# Patient Record
Sex: Male | Born: 1945 | Race: White | Hispanic: No | Marital: Married | Smoking: Never smoker
Health system: Southern US, Community
[De-identification: ages and names within clinical notes are randomized; demographics above are authoritative.]

## PROBLEM LIST (undated history)

## (undated) DIAGNOSIS — N529 Male erectile dysfunction, unspecified: Secondary | ICD-10-CM

## (undated) DIAGNOSIS — T7840XA Allergy, unspecified, initial encounter: Secondary | ICD-10-CM

## (undated) DIAGNOSIS — E785 Hyperlipidemia, unspecified: Secondary | ICD-10-CM

## (undated) DIAGNOSIS — I10 Essential (primary) hypertension: Secondary | ICD-10-CM

## (undated) DIAGNOSIS — Z8619 Personal history of other infectious and parasitic diseases: Secondary | ICD-10-CM

## (undated) HISTORY — PX: TONSILLECTOMY: SHX5217

## (undated) HISTORY — DX: Essential (primary) hypertension: I10

## (undated) HISTORY — DX: Personal history of other infectious and parasitic diseases: Z86.19

## (undated) HISTORY — DX: Allergy, unspecified, initial encounter: T78.40XA

## (undated) HISTORY — DX: Hyperlipidemia, unspecified: E78.5

## (undated) HISTORY — DX: Male erectile dysfunction, unspecified: N52.9

---

## 2008-02-25 ENCOUNTER — Encounter (INDEPENDENT_AMBULATORY_CARE_PROVIDER_SITE_OTHER): Payer: Self-pay | Admitting: *Deleted

## 2009-02-20 ENCOUNTER — Ambulatory Visit: Payer: Self-pay | Admitting: Family Medicine

## 2009-02-20 DIAGNOSIS — I10 Essential (primary) hypertension: Secondary | ICD-10-CM

## 2009-02-20 DIAGNOSIS — E785 Hyperlipidemia, unspecified: Secondary | ICD-10-CM

## 2009-02-20 DIAGNOSIS — N529 Male erectile dysfunction, unspecified: Secondary | ICD-10-CM | POA: Insufficient documentation

## 2009-02-20 HISTORY — DX: Hyperlipidemia, unspecified: E78.5

## 2009-02-20 HISTORY — DX: Essential (primary) hypertension: I10

## 2009-02-20 HISTORY — DX: Male erectile dysfunction, unspecified: N52.9

## 2009-02-22 LAB — CONVERTED CEMR LAB
ALT: 18 U/L
AST: 22 U/L
Albumin: 4.3 g/dL
Alkaline Phosphatase: 32 U/L — ABNORMAL LOW
BUN: 13 mg/dL
Bilirubin, Direct: 0 mg/dL
CO2: 32 meq/L
Calcium: 9.3 mg/dL
Chloride: 100 meq/L
Cholesterol: 179 mg/dL
Creatinine, Ser: 0.9 mg/dL
GFR calc non Af Amer: 90.47 mL/min
Glucose, Bld: 86 mg/dL
HDL: 55.2 mg/dL
LDL Cholesterol: 100 mg/dL — ABNORMAL HIGH
Potassium: 4.2 meq/L
Sodium: 137 meq/L
Total Bilirubin: 0.8 mg/dL
Total CHOL/HDL Ratio: 3
Total Protein: 6.3 g/dL
Triglycerides: 117 mg/dL
VLDL: 23.4 mg/dL

## 2009-12-25 ENCOUNTER — Ambulatory Visit: Payer: Self-pay | Admitting: Family Medicine

## 2009-12-25 LAB — CONVERTED CEMR LAB
HDL goal, serum: 40 mg/dL
LDL Goal: 130 mg/dL

## 2009-12-26 ENCOUNTER — Encounter (INDEPENDENT_AMBULATORY_CARE_PROVIDER_SITE_OTHER): Payer: Self-pay | Admitting: *Deleted

## 2009-12-26 LAB — CONVERTED CEMR LAB
ALT: 15 units/L (ref 0–53)
AST: 18 units/L (ref 0–37)
BUN: 12 mg/dL (ref 6–23)
Bilirubin, Direct: 0.1 mg/dL (ref 0.0–0.3)
Calcium: 9.7 mg/dL (ref 8.4–10.5)
Creatinine, Ser: 0.8 mg/dL (ref 0.4–1.5)
GFR calc non Af Amer: 109.66 mL/min (ref >60)
Glucose, Bld: 87 mg/dL (ref 70–99)
HDL: 61.7 mg/dL (ref >39.00)
Total Bilirubin: 0.7 mg/dL (ref 0.3–1.2)

## 2010-01-19 ENCOUNTER — Encounter (INDEPENDENT_AMBULATORY_CARE_PROVIDER_SITE_OTHER): Payer: Self-pay | Admitting: *Deleted

## 2010-01-22 ENCOUNTER — Ambulatory Visit: Payer: Self-pay | Admitting: Internal Medicine

## 2010-02-05 ENCOUNTER — Ambulatory Visit: Payer: Self-pay | Admitting: Internal Medicine

## 2010-02-05 LAB — HM COLONOSCOPY

## 2010-06-12 NOTE — Miscellaneous (Signed)
Summary: [facility] Previsit/prep  Clinical Lists Changes  Medications: Added new medication of MOVIPREP 100 GM  SOLR (PEG-KCL-NACL-NASULF-NA ASC-C) As per prep instructions. - Signed Rx of MOVIPREP 100 GM  SOLR (PEG-KCL-NACL-NASULF-NA ASC-C) As per prep instructions.;  #1 x 0;  Signed;  Entered by: Wyona Almas RN;  Authorized by: Hart Carwin MD;  Method used: [facility]  [address] [phone]*, [address], [place], [place]  [address], Ph: [phone] or [phone], Fax: [phone] Observations: Added new observation of NKA: T (01/22/2010 15:19)    Prescriptions: MOVIPREP 100 GM  SOLR (PEG-KCL-NACL-NASULF-NA ASC-C) As per prep instructions.  #1 x 0   Entered by:   Wyona Almas RN   Authorized by:   Hart Carwin MD   Signed by:   Wyona Almas RN on 01/22/2010   Method used:   [facility]  [address] [phone]* (retail)       [address]       [place], [place]  [address]       Ph: [phone] or [phone]       Fax: [phone]   RxID:   [id number]

## 2010-06-12 NOTE — Procedures (Signed)
Summary: Colonoscopy  Patient: [name] Note: All result statuses are Final unless otherwise noted.  Tests: (1) Colonoscopy (COL)   COL Colonoscopy           DONE     [facility]     [address]     [place], [place]  [address]           COLONOSCOPY PROCEDURE REPORT           PATIENT:  Paul Burton, Paul Burton  MR#:  [id number]     BIRTHDATE:  May 04, 1946, 64 yrs. old  GENDER:  male     ENDOSCOPIST:  Hedwig Morton. Juanda Chance, MD     REF. BY:  Evelena Peat, M.D.     PROCEDURE DATE:  02/05/2010     PROCEDURE:  Colonoscopy [address]     ASA CLASS:  Class I     INDICATIONS:  Elevated Risk Screening father with colon cancer     MEDICATIONS:   Versed 5 mg, Fentanyl 50 mcg           DESCRIPTION OF PROCEDURE:   After the risks benefits and     alternatives of the procedure were thoroughly explained, informed     consent was obtained.  Digital rectal exam was performed and     revealed no rectal masses.   The LB [id number] endoscope     was introduced through the anus and advanced to the cecum, which     was identified by both the appendix and ileocecal valve, without     limitations.  The quality of the prep was excellent, using     MoviPrep.  The instrument was then slowly withdrawn as the colon     was fully examined.     <<PROCEDUREIMAGES>>           FINDINGS:  Mild diverticulosis was found in the sigmoid colon (see     image1).  This was otherwise a normal examination of the colon     (see image2, image3, image4, and image5).   Retroflexed views in     the rectum revealed no abnormalities.    The scope was then     withdrawn from the patient and the procedure completed.           COMPLICATIONS:  None     ENDOSCOPIC IMPRESSION:     1) Mild diverticulosis in the sigmoid colon     2) Otherwise normal examination     RECOMMENDATIONS:     1) high fiber diet     REPEAT EXAM:  In 10 year(s) for.           ______________________________     Hedwig Morton. Juanda Chance, MD        CC:           n.     eSIGNED:   Hedwig Morton. [name] at 02/05/2010 01:54 PM           [name], [name], [phone]  Note: An exclamation mark (!) indicates a result that was not dispersed into the flowsheet. Document Creation Date: 02/05/2010 1:55 PM _______________________________________________________________________  (1) Order result status: Final Collection or observation date-time: 02/05/2010 13:49 Requested date-time:  Receipt date-time:  Reported date-time:  Referring Physician:   Ordering Physician: Lina Sar [phone]) Specimen Source:  Source: Launa Grill Order Number: [id number] Lab site:   Appended Document: Colonoscopy    Clinical Lists Changes  Observations: Added new observation of COLONNXTDUE: 01/2020 (02/05/2010 16:03)

## 2010-06-12 NOTE — Letter (Signed)
Summary: Pre Visit Letter Revised  [facility]  [address] [place], [place] [address]   Phone: [phone]  Fax: [phone]    12/26/2009 MRN: [id number]  [facility] [address] [place], [place]  [address]              Procedure Date:  02/05/2010    Welcome to the Gastroenterology Division at [facility].    You are scheduled to see a nurse for your pre-procedure visit on 01/22/2010 at 3:30PM on the 3rd floor at [facility], [address]  We ask that you try to arrive at our office 15 minutes prior to your appointment time to allow for check-in.  Please take a minute to review the attached form.  If you answer "Yes" to one or more of the questions on the first page, we ask that you call the person listed at your earliest opportunity.  If you answer "No" to all of the questions, please complete the rest of the form and bring it to your appointment.    Your nurse visit will consist of discussing your medical and surgical history, your immediate family medical history, and your medications.    If you are unable to list all of your medications on the form, please bring the medication bottles to your appointment and we will list them.  We will need to be aware of both prescribed and over the counter drugs.  We will need to know exact dosage information as well.    Please be prepared to read and sign documents such as consent forms, a financial agreement, and acknowledgement forms.  If necessary, and with your consent, a friend or relative is welcome to sit-in on the nurse visit with you.  Please bring your insurance card so that we may make a copy of it.  If your insurance requires a referral to see a specialist, please bring your referral form from your primary care physician.  No co-pay is required for this nurse visit.     If you cannot keep your appointment, please call [phone] to cancel or reschedule prior to your appointment date.  This  allows [place] the opportunity to schedule an appointment for another patient in need of care.   Thank you for choosing [facility] for your medical needs.  We appreciate the opportunity to care for you.  Please visit [place] at our website  to learn more about our practice.                     Sincerely,  The Gastroenterology Division

## 2010-06-12 NOTE — Assessment & Plan Note (Signed)
Summary: refill meds//ccm   Vital Signs:  Patient profile:   65 year old male Weight:      155 pounds Temp:     97.7 degrees F BP sitting:   126 / 70  History of Present Illness: Patient here for medical followup. Has lost almost 20 pounds since last visit due to his efforts. He has scaled back snacking and increased exercise. Feels very good overall.  Hypertension treated with Ramiri. Probable white coat syndrome. Blood pressure is well controlled by home readings.  Hyperlipidemia treated with Crestor. Doing well no side effects. Compliant with therapy.  Positive family history colon cancer in his father. Patient never had previous screening. He agrees at this point for referral.  Hypertension History:      He denies headache, chest pain, palpitations, dyspnea with exertion, orthopnea, PND, peripheral edema, visual symptoms, neurologic problems, syncope, and side effects from treatment.        Positive major cardiovascular risk factors include male age 39 years old or older, hyperlipidemia, and hypertension.  Negative major cardiovascular risk factors include no history of diabetes, negative family history for ischemic heart disease, and non-tobacco-user status.        Further assessment for target organ damage reveals no history of ASHD, stroke/TIA, or peripheral vascular disease.    Lipid Management History:      Positive NCEP/ATP III risk factors include male age 59 years old or older and hypertension.  Negative NCEP/ATP III risk factors include non-diabetic, no family history for ischemic heart disease, non-tobacco-user status, no ASHD (atherosclerotic heart disease), no prior stroke/TIA, no peripheral vascular disease, and no history of aortic aneurysm.      Allergies: No Known Drug Allergies  Past History:  Past Medical History: Last updated: 02/20/2009 Chicken pox Hay fever, allergies Hyperlipidemia Hypertension  Past Surgical History: Last updated:  02/20/2009 Tonsillectomy  1950  Family History: Last updated: 02/20/2009 Family History of Alcoholism/Addiction Family History of Colon CA  Family History Hypertension Family history Heart disease  Social History: Last updated: 02/20/2009 Occupation:  Interior and spatial designer of [employer] Married Never Smoked Alcohol use-yes  Risk Factors: Smoking Status: never (02/20/2009) PMH-FH-SH reviewed for relevance  Review of Systems      See HPI  Physical Exam  General:  Well-developed,well-nourished,in no acute distress; alert,appropriate and cooperative throughout examination Mouth:  Oral mucosa and oropharynx without lesions or exudates.  Teeth in good repair. Neck:  No deformities, masses, or tenderness noted. Lungs:  Normal respiratory effort, chest expands symmetrically. Lungs are clear to auscultation, no crackles or wheezes. Heart:  Normal rate and regular rhythm. S1 and S2 normal without gallop, murmur, click, rub or other extra sounds. Extremities:  No clubbing, cyanosis, edema, or deformity noted with normal full range of motion of all joints.   Cervical Nodes:  No lymphadenopathy noted Psych:  normally interactive, good eye contact, not anxious appearing, and not depressed appearing.     Impression & Recommendations:  Problem # 1:  HYPERLIPIDEMIA (ICD-272.4)  His updated medication list for this problem includes:    Crestor 5 Mg Tabs (Rosuvastatin calcium) ..... Once daily  Orders: TLB-Lipid Panel (80061-LIPID) TLB-Hepatic/Liver Function Pnl (80076-HEPATIC) Venipuncture ([address]) Specimen Handling ([address])  Problem # 2:  HYPERTENSION (ICD-401.9)  His updated medication list for this problem includes:    Ramipril 5 Mg Caps (Ramipril) ..... Once daily  Orders: TLB-BMP (Basic Metabolic Panel-BMET) (80048-METABOL) Venipuncture (09811) Specimen Handling ([address])  Problem # 3:  FAMILY HISTORY OF COLON CA 1ST DEGREE RELATIVE <60 (  ICD-V16.0) schedule screening  colonoscopy. Orders: Gastroenterology Referral (GI)  Complete Medication List: 1)  Crestor 5 Mg Tabs (Rosuvastatin calcium) .... Once daily 2)  Ramipril 5 Mg Caps (Ramipril) .... Once daily 3)  Aspirin 325 Mg Tabs (Aspirin) .... Once daily 4)  Tinactin Jock Itch 1 % Crea (Tolnaftate) .... Once daily 5)  Mens 50+ Multi Vitamin/min Tabs (Multiple vitamins-minerals) .... Once daily 6)  Viagra 100 Mg Tabs (Sildenafil citrate) .... One tablet by mouth once daily as directed.  Hypertension Assessment/Plan:      The patient's hypertensive risk group is category B: At least one risk factor (excluding diabetes) with no target organ damage.  His calculated 10 year risk of coronary heart disease is 11 %.  Today's blood pressure is 126/70.    Lipid Assessment/Plan:      Based on NCEP/ATP III, the patient's risk factor category is "2 or more risk factors and a calculated 10 year CAD risk of < 20%".  The patient's lipid goals are as follows: Total cholesterol goal is 200; LDL cholesterol goal is 130; HDL cholesterol goal is 40; Triglyceride goal is 150.    Patient Instructions: 1)  Please schedule a follow-up appointment in 1 year.  2)  It is important that you exercise reguarly at least 20 minutes 5 times a week. If you develop chest pain, have severe difficulty breathing, or feel very tired, stop exercising immediately and seek medical attention.  3)  Check your  Blood Pressure regularly . If it is above:140/90   you should make an appointment. Prescriptions: RAMIPRIL 5 MG CAPS (RAMIPRIL) once daily  #90 x 3   Entered and Authorized by:   Evelena Peat MD   Signed by:   Evelena Peat MD on 12/25/2009   Method used:   Faxed to ...       [facility]       [address] [place], [place]  [address]       Ph: [phone]       Fax: [phone]   RxID:   [id number] CRESTOR 5 MG TABS (ROSUVASTATIN CALCIUM) once daily  #90 x 3   Entered and Authorized by:   Evelena Peat MD    Signed by:   Evelena Peat MD on 12/25/2009   Method used:   Faxed to ...       [facility]       [address] [place], [place]  [address]       Ph: [phone]       Fax: [phone]   RxID:   [id number] RAMIPRIL 5 MG CAPS (RAMIPRIL) once daily  #90 x 3   Entered and Authorized by:   Evelena Peat MD   Signed by:   Evelena Peat MD on 12/25/2009   Method used:   Print then Give to Patient   RxID:   [id number] CRESTOR 5 MG TABS (ROSUVASTATIN CALCIUM) once daily  #90 x 3   Entered and Authorized by:   Evelena Peat MD   Signed by:   Evelena Peat MD on 12/25/2009   Method used:   Print then Give to Patient   RxID:   [id number]

## 2010-06-12 NOTE — Letter (Signed)
Summary: [facility] Instructions  [facility]  [address] [place], [place] [address]   Phone: [phone]  Fax: [phone]       Paul Burton    10-09-1945    MRN: [id number]        Procedure Day Dorna Bloom: [facility] 11/05/09     Arrival Time: 12:30PM     Procedure Time: 1:30PM     Location of Procedure:                    _X_  [facility] (4th Floor)                        PREPARATION FOR COLONOSCOPY WITH MOVIPREP   Starting 5 days prior to your procedure 01/31/2010 do not eat nuts, seeds, popcorn, corn, beans, peas,  salads, or any raw vegetables.  Do not take any fiber supplements (e.g. Metamucil, Citrucel, and Benefiber).  THE DAY BEFORE YOUR PROCEDURE         DATE: 02/04/10    DAY: SUNDAY  1.  Drink clear liquids the entire day-NO SOLID FOOD  2.  Do not drink anything colored red or purple.  Avoid juices with pulp.  No orange juice.  3.  Drink at least 64 oz. (8 glasses) of fluid/clear liquids during the day to prevent dehydration and help the prep work efficiently.  CLEAR LIQUIDS INCLUDE: Water Jello Ice Popsicles Tea (sugar ok, no milk/cream) Powdered fruit flavored drinks Coffee (sugar ok, no milk/cream) Gatorade Juice: apple, white grape, white cranberry  Lemonade Clear bullion, consomm, broth Carbonated beverages (any kind) Strained chicken noodle soup Hard Candy                             4.  In the morning, mix first dose of MoviPrep solution:    Empty 1 Pouch A and 1 Pouch B into the disposable container    Add lukewarm drinking water to the top line of the container. Mix to dissolve    Refrigerate (mixed solution should be used within 24 hrs)  5.  Begin drinking the prep at 5:00 p.m. The MoviPrep container is divided by 4 marks.   Every 15 minutes drink the solution down to the next mark (approximately 8 oz) until the full liter is complete.   6.  Follow completed prep with 16 oz of clear liquid of your choice  (Nothing red or purple).  Continue to drink clear liquids until bedtime.  7.  Before going to bed, mix second dose of MoviPrep solution:    Empty 1 Pouch A and 1 Pouch B into the disposable container    Add lukewarm drinking water to the top line of the container. Mix to dissolve    Refrigerate  THE DAY OF YOUR PROCEDURE      DATE: 02/05/10    DAY: MONDAY  Beginning at 8:30AM (5 hours before procedure):         1. Every 15 minutes, drink the solution down to the next mark (approx 8 oz) until the full liter is complete.  2. Follow completed prep with 16 oz. of clear liquid of your choice.    3. You may drink clear liquids until 11:30AM (2 HOURS BEFORE PROCEDURE).   MEDICATION INSTRUCTIONS  Unless otherwise instructed, you should take regular prescription medications with a small sip of water   as early as possible the morning of  your procedure.         OTHER INSTRUCTIONS  You will need a responsible adult at least 65 years of age to accompany you and drive you home.   This person must remain in the waiting room during your procedure.  Wear loose fitting clothing that is easily removed.  Leave jewelry and other valuables at home.  However, you may wish to bring a book to read or  an iPod/MP3 player to listen to music as you wait for your procedure to start.  Remove all body piercing jewelry and leave at home.  Total time from sign-in until discharge is approximately 2-3 hours.  You should go home directly after your procedure and rest.  You can resume normal activities the  day after your procedure.  The day of your procedure you should not:   Drive   Make legal decisions   Operate machinery   Drink alcohol   Return to work  You will receive specific instructions about eating, activities and medications before you leave.    The above instructions have been reviewed and explained to me by  Wyona Almas RN  January 22, 2010 3:51 PM     I fully  understand and can verbalize these instructions _____________________________ Date _________

## 2011-01-15 ENCOUNTER — Other Ambulatory Visit: Payer: Self-pay | Admitting: Family Medicine

## 2011-03-14 ENCOUNTER — Encounter: Payer: Self-pay | Admitting: Family Medicine

## 2011-03-15 ENCOUNTER — Ambulatory Visit (INDEPENDENT_AMBULATORY_CARE_PROVIDER_SITE_OTHER): Payer: 59 | Admitting: Family Medicine

## 2011-03-15 ENCOUNTER — Encounter: Payer: Self-pay | Admitting: Family Medicine

## 2011-03-15 DIAGNOSIS — I10 Essential (primary) hypertension: Secondary | ICD-10-CM

## 2011-03-15 DIAGNOSIS — E785 Hyperlipidemia, unspecified: Secondary | ICD-10-CM

## 2011-03-15 LAB — LIPID PANEL
Cholesterol: 177 mg/dL (ref 0–200)
Total CHOL/HDL Ratio: 3
Triglycerides: 144 mg/dL (ref 0.0–149.0)

## 2011-03-15 LAB — HEPATIC FUNCTION PANEL
ALT: 15 U/L (ref 0–53)
AST: 18 U/L (ref 0–37)
Albumin: 4.4 g/dL (ref 3.5–5.2)
Alkaline Phosphatase: 31 U/L — ABNORMAL LOW (ref 39–117)

## 2011-03-15 MED ORDER — ROSUVASTATIN CALCIUM 5 MG PO TABS: 5.0000 mg | ORAL_TABLET | Freq: Every day | ORAL | Status: DC

## 2011-03-15 MED ORDER — RAMIPRIL 5 MG PO CAPS
5.0000 mg | ORAL_CAPSULE | Freq: Every day | ORAL | Status: DC
Start: 2011-03-15 — End: 2012-04-26

## 2011-03-15 NOTE — Patient Instructions (Signed)
Consider complete physical at some point this year.  

## 2011-03-15 NOTE — Progress Notes (Signed)
  Subjective:    Patient ID: Paul Burton, male    DOB: 08/21/1945, 65 y.o.   MRN: [id number]  HPI  Medical followup. Hyperlipidemia treated with Crestor 5 mg daily. No myalgias. No history of peripheral vascular disease or CAD. No recent chest pains.  Hypertension treated with Altace 5 mg daily. Blood pressure well controlled. Walks for exercise. No dizziness. No cough or any other side effects. Compliant with therapy. Patient is nonsmoker   Review of Systems  Constitutional: Negative for fatigue.  Eyes: Negative for visual disturbance.  Respiratory: Negative for cough, chest tightness and shortness of breath.   Cardiovascular: Negative for chest pain, palpitations and leg swelling.  Neurological: Negative for dizziness, syncope, weakness, light-headedness and headaches.       Objective:   Physical Exam  Constitutional: He appears well-developed and well-nourished.  Neck: Neck supple. No thyromegaly present.  Cardiovascular: Normal rate, regular rhythm and normal heart sounds.   No murmur heard. Pulmonary/Chest: Effort normal and breath sounds normal. No respiratory distress. He has no wheezes. He has no rales.  Musculoskeletal: He exhibits no edema.          Assessment & Plan:  #1 hypertension. Stable. Refill altace for one year #2 hyperlipidemia. Recheck lipid and hepatic panel. Refill Crestor for one year. Consider complete physical some point this year. Flu vaccine offered and declined

## 2011-03-19 NOTE — Progress Notes (Signed)
Quick Note:  Pt informed on home VM ______ 

## 2012-04-26 ENCOUNTER — Other Ambulatory Visit: Payer: Self-pay | Admitting: Family Medicine

## 2012-04-29 ENCOUNTER — Ambulatory Visit (INDEPENDENT_AMBULATORY_CARE_PROVIDER_SITE_OTHER): Payer: 59 | Admitting: Family Medicine

## 2012-04-29 ENCOUNTER — Encounter: Payer: Self-pay | Admitting: Family Medicine

## 2012-04-29 VITALS — BP 130/80 | Temp 97.9°F | Wt 169.0 lb

## 2012-04-29 DIAGNOSIS — I10 Essential (primary) hypertension: Secondary | ICD-10-CM

## 2012-04-29 DIAGNOSIS — E785 Hyperlipidemia, unspecified: Secondary | ICD-10-CM

## 2012-04-29 LAB — HEPATIC FUNCTION PANEL
ALT: 20 U/L (ref 0–53)
Alkaline Phosphatase: 32 U/L — ABNORMAL LOW (ref 39–117)
Bilirubin, Direct: 0 mg/dL (ref 0.0–0.3)
Total Bilirubin: 0.9 mg/dL (ref 0.3–1.2)

## 2012-04-29 LAB — BASIC METABOLIC PANEL
BUN: 12 mg/dL (ref 6–23)
Chloride: 104 mEq/L (ref 96–112)
GFR: 93.14 mL/min (ref >60.00)
Glucose, Bld: 87 mg/dL (ref 70–99)
Potassium: 5.2 mEq/L — ABNORMAL HIGH (ref 3.5–5.1)

## 2012-04-29 LAB — LIPID PANEL: VLDL: 14.2 mg/dL (ref 0.0–40.0)

## 2012-04-29 MED ORDER — ROSUVASTATIN CALCIUM 5 MG PO TABS: 5.0000 mg | ORAL_TABLET | Freq: Every day | ORAL | Status: DC

## 2012-04-29 NOTE — Patient Instructions (Addendum)
Consider Pneumovax (pneumonia vaccine) as a one time vaccine.

## 2012-04-29 NOTE — Progress Notes (Signed)
  Subjective:    Patient ID: Paul Burton, male    DOB: Dec 09, 1945, 66 y.o.   MRN: [id number]  HPI  Patient seen for followup on medications. He has hypertension and hyperlipidemia. Takes Altace 5 mg daily and Crestor 5 mg daily. No side effects. Walks about 2 miles per day for exercise. No chest pains. No dizziness. Declines flu vaccine. Nonsmoker  Past Medical History  Diagnosis Date  . HYPERLIPIDEMIA 02/20/2009  . HYPERTENSION 02/20/2009  . 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 Allergy   . History of chicken pox    Past Surgical History  Procedure Date  . Tonsillectomy     reports that he has never smoked. He has never used smokeless tobacco. He reports that he drinks alcohol. He reports that he does not use illicit drugs. family history is negative for Alcohol abuse, and Cancer, and Hypertension, and Heart disease, . No Known Allergies    Review of Systems  Constitutional: Negative for fatigue.  Eyes: Negative for visual disturbance.  Respiratory: Negative for cough, chest tightness and shortness of breath.   Cardiovascular: Negative for chest pain, palpitations and leg swelling.  Neurological: Negative for dizziness, syncope, weakness, light-headedness and headaches.       Objective:   Physical Exam  Constitutional: He appears well-developed and well-nourished.  Neck: Neck supple. No thyromegaly present.  Cardiovascular: Normal rate and regular rhythm.   Pulmonary/Chest: Effort normal and breath sounds normal. No respiratory distress. He has no wheezes. He has no rales.  Musculoskeletal: He exhibits no edema.          Assessment & Plan:  #1 hypertension. Stable at goal. Refill Altace for one year #2 hyperlipidemia. Refill Crestor for one year. Check lipid and hepatic panel. #3 health maintenance. Flu vaccine declined. Recommend consider complete physical at some point this year

## 2012-04-30 NOTE — Progress Notes (Signed)
Quick Note:  Pt wife informed ______ 

## 2013-04-22 ENCOUNTER — Other Ambulatory Visit: Payer: Self-pay | Admitting: Family Medicine

## 2013-07-28 ENCOUNTER — Encounter: Payer: Self-pay | Admitting: Family Medicine

## 2013-07-28 ENCOUNTER — Ambulatory Visit (INDEPENDENT_AMBULATORY_CARE_PROVIDER_SITE_OTHER): Payer: 59 | Admitting: Family Medicine

## 2013-07-28 ENCOUNTER — Telehealth: Payer: Self-pay | Admitting: Family Medicine

## 2013-07-28 VITALS — BP 120/74 | HR 68 | Ht 68.0 in | Wt 168.0 lb

## 2013-07-28 DIAGNOSIS — I10 Essential (primary) hypertension: Secondary | ICD-10-CM

## 2013-07-28 DIAGNOSIS — Z0000 Encounter for general adult medical examination without abnormal findings: Secondary | ICD-10-CM

## 2013-07-28 DIAGNOSIS — E785 Hyperlipidemia, unspecified: Secondary | ICD-10-CM

## 2013-07-28 DIAGNOSIS — Z23 Encounter for immunization: Secondary | ICD-10-CM

## 2013-07-28 DIAGNOSIS — N529 Male erectile dysfunction, unspecified: Secondary | ICD-10-CM

## 2013-07-28 LAB — LIPID PANEL
CHOL/HDL RATIO: 3
Cholesterol: 161 mg/dL (ref 0–200)
HDL: 61.1 mg/dL (ref >39.00)
LDL CALC: 83 mg/dL (ref 0–99)
Triglycerides: 83 mg/dL (ref 0.0–149.0)
VLDL: 16.6 mg/dL (ref 0.0–40.0)

## 2013-07-28 LAB — BASIC METABOLIC PANEL
BUN: 12 mg/dL (ref 6–23)
CHLORIDE: 103 meq/L (ref 96–112)
CO2: 29 meq/L (ref 19–32)
CREATININE: 1 mg/dL (ref 0.4–1.5)
Calcium: 9.5 mg/dL (ref 8.4–10.5)
GFR: 82.83 mL/min (ref >60.00)
Glucose, Bld: 89 mg/dL (ref 70–99)
Potassium: 5 mEq/L (ref 3.5–5.1)
Sodium: 139 mEq/L (ref 135–145)

## 2013-07-28 LAB — HEPATIC FUNCTION PANEL
ALBUMIN: 4.6 g/dL (ref 3.5–5.2)
ALT: 15 U/L (ref 0–53)
AST: 21 U/L (ref 0–37)
Alkaline Phosphatase: 31 U/L — ABNORMAL LOW (ref 39–117)
Bilirubin, Direct: 0 mg/dL (ref 0.0–0.3)
Total Bilirubin: 1 mg/dL (ref 0.3–1.2)
Total Protein: 7.1 g/dL (ref 6.0–8.3)

## 2013-07-28 MED ORDER — SILDENAFIL CITRATE 100 MG PO TABS: 100.0000 mg | ORAL_TABLET | Freq: Every day | ORAL | Status: DC | PRN

## 2013-07-28 MED ORDER — ROSUVASTATIN CALCIUM 5 MG PO TABS: 5.0000 mg | ORAL_TABLET | Freq: Every day | ORAL | Status: DC

## 2013-07-28 MED ORDER — RAMIPRIL 5 MG PO CAPS: 5.0000 mg | ORAL_CAPSULE | Freq: Every day | ORAL | Status: DC

## 2013-07-28 NOTE — Progress Notes (Signed)
Subjective:    Patient ID: Paul Burton, male    DOB: [birth year], 68 y.o.   MRN: [id number]  HPI Patient is here for Medicare wellness exam and medical followup.  Has hypertension and hyperlipidemia. Medications reviewed. Compliant with all. No side effects. He also takes Viagra for erectile dysfunction. Nonsmoker. Walks most days for exercise. No history of pneumonia vaccine. No history of shingles vaccine. Colonoscopy few years ago reportedly normal. He has no history of CAD but no family history of premature CAD. No recent chest pain or business with exercise  Past Medical History  Diagnosis Date  . HYPERLIPIDEMIA 02/20/2009  . HYPERTENSION 02/20/2009  . 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 Allergy   . History of chicken pox    Past Surgical History  Procedure Laterality Date  . Tonsillectomy      reports that he has never smoked. He has never used smokeless tobacco. He reports that he drinks alcohol. He reports that he does not use illicit drugs. family history includes Stroke in his mother. There is no history of Alcohol abuse, Cancer, or Hypertension. No Known Allergies  1.  Risk factors based on Past Medical , Social, and Family history reviewed and as above 2.  Limitations in physical activities stays very active. Low risk for fall 3.  Depression/mood no depression or anxiety issues 4.  Hearing intact 5.  ADLs independent in all 6.  Cognitive function (orientation to time and place, language, writing, speech,memory) memory intact. Language and judgment intact 7.  Home Safety no specific issues identified 8.  Height, weight, and visual acuity. Stable 9.  Counseling discussed the importance of ongoing weightbearing exercise 10. Recommendation of preventive services. Prevnar 13. Shingles vaccine not covered by his insurance and he wishes to defer 49. Labs based on risk factors lipid, basic metabolic panel, and hepatic panel 12. Care Plan as above    Review of  Systems  Constitutional: Negative for fever, activity change, appetite change and fatigue.  HENT: Negative for congestion, ear pain and trouble swallowing.   Eyes: Negative for pain and visual disturbance.  Respiratory: Negative for cough, shortness of breath and wheezing.   Cardiovascular: Negative for chest pain and palpitations.  Gastrointestinal: Negative for nausea, vomiting, abdominal pain, diarrhea, constipation, blood in stool, abdominal distention and rectal pain.  Genitourinary: Negative for dysuria, hematuria and testicular pain.  Musculoskeletal: Negative for arthralgias and joint swelling.  Skin: Negative for rash.  Neurological: Negative for dizziness, syncope and headaches.  Hematological: Negative for adenopathy.  Psychiatric/Behavioral: Negative for confusion and dysphoric mood.       Objective:   Physical Exam  Constitutional: He is oriented to person, place, and time. He appears well-developed and well-nourished. No distress.  HENT:  Head: Normocephalic and atraumatic.  Right Ear: External ear normal.  Left Ear: External ear normal.  Mouth/Throat: Oropharynx is clear and moist.  Eyes: Conjunctivae and EOM are normal. Pupils are equal, round, and reactive to light.  Neck: Normal range of motion. Neck supple. No thyromegaly present.  Cardiovascular: Normal rate, regular rhythm and normal heart sounds.   No murmur heard. Pulmonary/Chest: No respiratory distress. He has no wheezes. He has no rales.  Abdominal: Soft. Bowel sounds are normal. He exhibits no distension and no mass. There is no tenderness. There is no rebound and no guarding.  Musculoskeletal: He exhibits no edema.  Lymphadenopathy:    He has no cervical adenopathy.  Neurological: He is alert and oriented to person, place, and  time. He displays normal reflexes. No cranial nerve deficit.  Skin: No rash noted.  Psychiatric: He has a normal mood and affect.          Assessment & Plan:  Physical  exam. Prevnar 13 given. Shingles vaccine not covered by his insurance and he wishes to defer. Colonoscopy up to date  Hypertension which is stable. Refill Altace for one year  Hyperlipidemia. Repeat lipid and hepatic panel. Refill Crestor for one year  Erectile dysfunction. Refill Viagra for as needed use

## 2013-07-28 NOTE — Progress Notes (Signed)
Pre visit review using our clinic review tool, if applicable. No additional management support is needed unless otherwise documented below in the visit note. 

## 2013-07-28 NOTE — Addendum Note (Signed)
Addended by: Marcina Millard on: 07/28/2013 10:55 AM   Modules accepted: Orders

## 2013-07-28 NOTE — Telephone Encounter (Signed)
Relevant patient education assigned to patient using Emmi. ° °

## 2013-07-28 NOTE — Patient Instructions (Signed)
Continue regular exercise with walking Let us know if you wish to consider shingles vaccine

## 2014-07-13 ENCOUNTER — Other Ambulatory Visit: Payer: Self-pay | Admitting: Family Medicine

## 2014-07-28 ENCOUNTER — Other Ambulatory Visit: Payer: Self-pay | Admitting: Family Medicine

## 2014-08-08 ENCOUNTER — Ambulatory Visit (INDEPENDENT_AMBULATORY_CARE_PROVIDER_SITE_OTHER): Payer: 59 | Admitting: Family Medicine

## 2014-08-08 ENCOUNTER — Encounter: Payer: Self-pay | Admitting: Family Medicine

## 2014-08-08 VITALS — BP 120/80 | HR 60 | Temp 97.4°F | Wt 152.0 lb

## 2014-08-08 DIAGNOSIS — I10 Essential (primary) hypertension: Secondary | ICD-10-CM | POA: Diagnosis not present

## 2014-08-08 DIAGNOSIS — E785 Hyperlipidemia, unspecified: Secondary | ICD-10-CM

## 2014-08-08 LAB — HEPATIC FUNCTION PANEL
ALBUMIN: 4.4 g/dL (ref 3.5–5.2)
ALT: 11 U/L (ref 0–53)
AST: 16 U/L (ref 0–37)
Alkaline Phosphatase: 30 U/L — ABNORMAL LOW (ref 39–117)
BILIRUBIN DIRECT: 0.1 mg/dL (ref 0.0–0.3)
TOTAL PROTEIN: 7.3 g/dL (ref 6.0–8.3)
Total Bilirubin: 0.8 mg/dL (ref 0.2–1.2)

## 2014-08-08 LAB — BASIC METABOLIC PANEL
BUN: 15 mg/dL (ref 6–23)
CO2: 28 mEq/L (ref 19–32)
Calcium: 9.8 mg/dL (ref 8.4–10.5)
Chloride: 101 mEq/L (ref 96–112)
Creatinine, Ser: 0.9 mg/dL (ref 0.40–1.50)
GFR: 88.96 mL/min (ref >60.00)
GLUCOSE: 86 mg/dL (ref 70–99)
POTASSIUM: 4.5 meq/L (ref 3.5–5.1)
Sodium: 137 mEq/L (ref 135–145)

## 2014-08-08 LAB — LIPID PANEL
CHOL/HDL RATIO: 3
Cholesterol: 162 mg/dL (ref 0–200)
HDL: 63.6 mg/dL (ref >39.00)
LDL Cholesterol: 77 mg/dL (ref 0–99)
NonHDL: 98.4
TRIGLYCERIDES: 109 mg/dL (ref 0.0–149.0)
VLDL: 21.8 mg/dL (ref 0.0–40.0)

## 2014-08-08 MED ORDER — RAMIPRIL 5 MG PO CAPS: 5.0000 mg | ORAL_CAPSULE | Freq: Every day | ORAL | Status: DC

## 2014-08-08 MED ORDER — ROSUVASTATIN CALCIUM 5 MG PO TABS: 5.0000 mg | ORAL_TABLET | Freq: Every day | ORAL | Status: DC

## 2014-08-08 NOTE — Progress Notes (Signed)
Pre visit review using our clinic review tool, if applicable. No additional management support is needed unless otherwise documented below in the visit note. 

## 2014-08-08 NOTE — Progress Notes (Signed)
   Subjective:    Patient ID: Paul Burton, male    DOB: 15-Jul-1945, 69 y.o.   MRN: [id number]  HPI  Patient here for routine medical follow-up. Hypertension and hyperlipidemia which has been well controlled. He exercises about 6 days per week. No recent chest pains. Blood pressure very well controlled by home readings. Compliant with medications.  Past Medical History  Diagnosis Date  . HYPERLIPIDEMIA 02/20/2009  . HYPERTENSION 02/20/2009  . 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 Allergy   . History of chicken pox    Past Surgical History  Procedure Laterality Date  . Tonsillectomy      reports that he has never smoked. He has never used smokeless tobacco. He reports that he drinks alcohol. He reports that he does not use illicit drugs. family history includes Stroke in his mother. There is no history of Alcohol abuse, Cancer, or Hypertension. No Known Allergies   Review of Systems  Constitutional: Negative for fatigue.  Eyes: Negative for visual disturbance.  Respiratory: Negative for cough, chest tightness and shortness of breath.   Cardiovascular: Negative for chest pain, palpitations and leg swelling.  Endocrine: Negative for polydipsia and polyuria.  Neurological: Negative for dizziness, syncope, weakness, light-headedness and headaches.       Objective:   Physical Exam  Constitutional: He appears well-developed and well-nourished.  Neck: Neck supple. No thyromegaly present.  Cardiovascular: Normal rate and regular rhythm.   Pulmonary/Chest: Effort normal and breath sounds normal. No respiratory distress. He has no wheezes. He has no rales.  Musculoskeletal: He exhibits no edema.          Assessment & Plan:  #1 hypertension. Very well controlled by home readings. Continue  Ramipril with refills given for one year #2 hyperlipidemia. Patient on low-dose Crestor. Refill for 1 year. Check lipid and hepatic panel.

## 2015-05-18 ENCOUNTER — Telehealth: Payer: Self-pay | Admitting: Family Medicine

## 2015-05-18 MED ORDER — ROSUVASTATIN CALCIUM 5 MG PO TABS: 5.0000 mg | ORAL_TABLET | Freq: Every day | ORAL | Status: DC

## 2015-05-18 MED ORDER — RAMIPRIL 5 MG PO CAPS: 5.0000 mg | ORAL_CAPSULE | Freq: Every day | ORAL | Status: DC

## 2015-05-18 NOTE — Telephone Encounter (Signed)
Pt has new mail order pharm [facility]. Pt needs news rxs rosuvastatin and ramipril #90 each w/refills. [facility] fax # [phone]

## 2015-05-18 NOTE — Telephone Encounter (Signed)
Medications sent to mail order fax number.

## 2016-02-15 DIAGNOSIS — H01021 Squamous blepharitis right upper eyelid: Secondary | ICD-10-CM | POA: Diagnosis not present

## 2016-02-15 DIAGNOSIS — H2513 Age-related nuclear cataract, bilateral: Secondary | ICD-10-CM | POA: Diagnosis not present

## 2016-02-15 DIAGNOSIS — H01024 Squamous blepharitis left upper eyelid: Secondary | ICD-10-CM | POA: Diagnosis not present

## 2016-03-13 ENCOUNTER — Encounter: Payer: Self-pay | Admitting: Family Medicine

## 2016-03-13 ENCOUNTER — Ambulatory Visit (INDEPENDENT_AMBULATORY_CARE_PROVIDER_SITE_OTHER): Payer: Medicare Other | Admitting: Family Medicine

## 2016-03-13 VITALS — BP 138/70 | HR 76 | Temp 97.5°F | Ht 67.0 in | Wt 154.0 lb

## 2016-03-13 DIAGNOSIS — Z0000 Encounter for general adult medical examination without abnormal findings: Secondary | ICD-10-CM | POA: Diagnosis not present

## 2016-03-13 DIAGNOSIS — I10 Essential (primary) hypertension: Secondary | ICD-10-CM

## 2016-03-13 DIAGNOSIS — Z23 Encounter for immunization: Secondary | ICD-10-CM

## 2016-03-13 DIAGNOSIS — E785 Hyperlipidemia, unspecified: Secondary | ICD-10-CM

## 2016-03-13 DIAGNOSIS — N529 Male erectile dysfunction, unspecified: Secondary | ICD-10-CM

## 2016-03-13 LAB — BASIC METABOLIC PANEL
BUN: 15 mg/dL (ref 6–23)
CALCIUM: 9.7 mg/dL (ref 8.4–10.5)
CO2: 29 mEq/L (ref 19–32)
Chloride: 101 mEq/L (ref 96–112)
Creatinine, Ser: 0.93 mg/dL (ref 0.40–1.50)
GFR: 85.26 mL/min (ref >60.00)
Glucose, Bld: 85 mg/dL (ref 70–99)
Potassium: 4.4 mEq/L (ref 3.5–5.1)
SODIUM: 139 meq/L (ref 135–145)

## 2016-03-13 LAB — HEPATIC FUNCTION PANEL
ALBUMIN: 4.5 g/dL (ref 3.5–5.2)
ALT: 13 U/L (ref 0–53)
AST: 16 U/L (ref 0–37)
Alkaline Phosphatase: 30 U/L — ABNORMAL LOW (ref 39–117)
BILIRUBIN DIRECT: 0.1 mg/dL (ref 0.0–0.3)
TOTAL PROTEIN: 6.9 g/dL (ref 6.0–8.3)
Total Bilirubin: 0.8 mg/dL (ref 0.2–1.2)

## 2016-03-13 LAB — LIPID PANEL
CHOL/HDL RATIO: 3
CHOLESTEROL: 190 mg/dL (ref 0–200)
HDL: 67.8 mg/dL (ref >39.00)
LDL CALC: 100 mg/dL — AB (ref 0–99)
NonHDL: 121.89
TRIGLYCERIDES: 111 mg/dL (ref 0.0–149.0)
VLDL: 22.2 mg/dL (ref 0.0–40.0)

## 2016-03-13 MED ORDER — SILDENAFIL CITRATE 100 MG PO TABS: 100.0000 mg | ORAL_TABLET | Freq: Every day | ORAL | 5 refills | Status: DC | PRN

## 2016-03-13 MED ORDER — ROSUVASTATIN CALCIUM 5 MG PO TABS: 5.0000 mg | ORAL_TABLET | Freq: Every day | ORAL | 3 refills | Status: DC

## 2016-03-13 MED ORDER — RAMIPRIL 5 MG PO CAPS: 5.0000 mg | ORAL_CAPSULE | Freq: Every day | ORAL | 3 refills | Status: DC

## 2016-03-13 NOTE — Patient Instructions (Signed)
Health Maintenance  Topic Date Due  . Hepatitis C Screening  1945-06-01  . PNA vac Low Risk Adult (2 of 2 - PPSV23) 07/29/2014  . INFLUENZA VACCINE  03/13/2017 (Originally 12/12/2015)  . ZOSTAVAX  03/13/2017 (Originally 09/25/2005)  . TETANUS/TDAP  05/13/2016  . COLONOSCOPY  02/06/2020

## 2016-03-13 NOTE — Progress Notes (Signed)
Pre visit review using our clinic review tool, if applicable. No additional management support is needed unless otherwise documented below in the visit note. 

## 2016-03-13 NOTE — Progress Notes (Signed)
Subjective:     Patient ID: Paul Burton, male   DOB: 1946/01/18, 70 y.o.   MRN: [id number]  HPI Patient here for Medicare subsequent annual wellness visit and medical follow-up. Medical problems include history of hyperlipidemia, hypertension, erectile dysfunction. He takes Altace, Crestor, and Viagra along with aspirin one daily. He declines flu vaccine and Zostavax. He needs Pneumovax and does agree to that. Exercise with walking 1-1/2-2 miles per day. Nonsmoker. No regular alcohol use. Compliant with medications. Blood pressure has been very well controlled by home readings which are reviewed today. He brings in a list of several readings for the past couple of months. Denies any myalgias or other side effects from Crestor  Past Medical History:  Diagnosis Date  . Allergy   . History of chicken pox   . HYPERLIPIDEMIA 02/20/2009  . HYPERTENSION 02/20/2009  . 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 TONSILLECTOMY      reports that he has never smoked. He has never used smokeless tobacco. He reports that he drinks alcohol. He reports that he does not use drugs. family history includes Stroke in his mother. No Known Allergies  1.  Risk factors based on Past Medical , Social, and Family history reviewed and as indicated above with no changes 2.  Limitations in physical activities None.  No recent falls. 3.  Depression/mood No active depression or anxiety issues 4.  Hearing No defiits 5.  ADLs independent in all. 6.  Cognitive function (orientation to time and place, language, writing, speech,memory) no short or long term memory issues.  Language and judgement intact. 7.  Home Safety no issues 8.  Height, weight, and visual acuity.all stable. 9.  Counseling discussed shingles vaccine.  Discussed pros and cons of PSA testing. 10. Recommendation of preventive services.  Pneumovax.  He declines Zostavax.   11. Labs based on risk  factors-lipid, hepatic, BMP. 12. Care Plan as above. 13. Other Providers-none 14. Written schedule of screening/prevention services given to patient.   Review of Systems  Constitutional: Negative for fatigue.  Eyes: Negative for visual disturbance.  Respiratory: Negative for cough, chest tightness and shortness of breath.   Cardiovascular: Negative for chest pain, palpitations and leg swelling.  Neurological: Negative for dizziness, syncope, weakness, light-headedness and headaches.       Objective:   Physical Exam  Constitutional: He is oriented to person, place, and time. He appears well-developed and well-nourished.  HENT:  Right Ear: External ear normal.  Left Ear: External ear normal.  Mouth/Throat: Oropharynx is clear and moist.  Eyes: Pupils are equal, round, and reactive to light.  Neck: Neck supple. No thyromegaly present.  Cardiovascular: Normal rate and regular rhythm.   Pulmonary/Chest: Effort normal and breath sounds normal. No respiratory distress. He has no wheezes. He has no rales.  Musculoskeletal: He exhibits no edema.  Neurological: He is alert and oriented to person, place, and time.       Assessment:     #1 Medicare subsequent annual wellness visit  #2 hypertension well controlled by home readings  #3 dyslipidemia   #4 erectile dysfunction    Plan:     -Refill regular medications as above for one year -Obtain screening labs including basic metabolic panel, lipid panel, hepatic panel -Pvx given. -Continue regular exercise habits -Recommend consideration for Zostavax and flu vaccine and he declines both -The natural history of prostate cancer and ongoing controversy regarding screening and potential treatment outcomes of  prostate cancer has been discussed with the patient. The meaning of a false positive PSA and a false negative PSA has been discussed. He indicates understanding of the limitations of this screening test and wishes not to proceed with  screening PSA testing.

## 2016-03-15 ENCOUNTER — Telehealth: Payer: Self-pay | Admitting: Family Medicine

## 2016-03-15 MED ORDER — SILDENAFIL CITRATE 100 MG PO TABS: 100.0000 mg | ORAL_TABLET | Freq: Every day | ORAL | 5 refills | Status: DC | PRN

## 2016-03-15 NOTE — Telephone Encounter (Signed)
Medication sent in for patient. 

## 2016-03-15 NOTE — Telephone Encounter (Signed)
°  Pharmacy said they did not received the  RX   Pt request refill of the following:  sildenafil (VIAGRA) 100 MG tablet   Phamacy:  [facility] [address]

## 2017-03-31 ENCOUNTER — Ambulatory Visit (INDEPENDENT_AMBULATORY_CARE_PROVIDER_SITE_OTHER): Payer: Medicare Other | Admitting: Family Medicine

## 2017-03-31 ENCOUNTER — Encounter: Payer: Self-pay | Admitting: Family Medicine

## 2017-03-31 VITALS — BP 110/68 | HR 70 | Temp 98.4°F | Ht 66.5 in | Wt 151.6 lb

## 2017-03-31 DIAGNOSIS — Z0000 Encounter for general adult medical examination without abnormal findings: Secondary | ICD-10-CM

## 2017-03-31 DIAGNOSIS — E785 Hyperlipidemia, unspecified: Secondary | ICD-10-CM

## 2017-03-31 DIAGNOSIS — I10 Essential (primary) hypertension: Secondary | ICD-10-CM | POA: Diagnosis not present

## 2017-03-31 MED ORDER — RAMIPRIL 5 MG PO CAPS: 5.0000 mg | ORAL_CAPSULE | Freq: Every day | ORAL | 3 refills | Status: DC

## 2017-03-31 MED ORDER — ROSUVASTATIN CALCIUM 5 MG PO TABS: 5.0000 mg | ORAL_TABLET | Freq: Every day | ORAL | 3 refills | Status: DC

## 2017-03-31 NOTE — Progress Notes (Signed)
Subjective:     Patient ID: Paul Burton, male   DOB: [birth year], 71 y.o.   MRN: [id number]  HPI Patient seen for Medicare wellness visit and medical follow-up. His chronic problems include history of hyperlipidemia and hypertension. Medications reviewed. Compliant with all. He has lost about 8 pounds from last year which he attributes to basically eliminating alcohol intake. He feels better overall. Still has good appetite. Walks regularly for exercise.  1.  Risk factors based on Past Medical , Social, and Family history reviewed and as indicated above with no changes 2.  Limitations in physical activities None.  No recent falls.  Walks for exercise 3.  Depression/mood No active depression or anxiety issues 4.  Hearing No defiits 5.  ADLs independent in all. 6.  Cognitive function (orientation to time and place, language, writing, speech,memory) no short or long term memory issues.  Language and judgement intact. 7.  Home Safety no issues 8.  Height, weight, and visual acuity.all stable. 9.  Counseling discussed regular exercise.  Not a candidate for AAA screen as no hx of smoking.   10. Recommendation of preventive services. Needs tetanus booster. 11. Labs based on risk factors-lipid, hepatic, BMP 12. Care Plan- as above. 13. Other Providers-none 14. Written schedule of screening/prevention services given to patient.  Past Medical History:  Diagnosis Date  . Allergy   . History of chicken pox   . HYPERLIPIDEMIA 02/20/2009  . HYPERTENSION 02/20/2009  . 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 TONSILLECTOMY      reports that  has never smoked. he has never used smokeless tobacco. He reports that he drinks alcohol. He reports that he does not use drugs. family history includes Stroke in his mother. No Known Allergies   Review of Systems  Constitutional: Negative for activity change, appetite change, fatigue and fever.   HENT: Negative for congestion, ear pain and trouble swallowing.   Eyes: Negative for pain and visual disturbance.  Respiratory: Negative for cough, shortness of breath and wheezing.   Cardiovascular: Negative for chest pain and palpitations.  Gastrointestinal: Negative for abdominal distention, abdominal pain, blood in stool, constipation, diarrhea, nausea, rectal pain and vomiting.  Genitourinary: Negative for dysuria, hematuria and testicular pain.  Musculoskeletal: Negative for arthralgias and joint swelling.  Skin: Negative for rash.  Neurological: Negative for dizziness, syncope and headaches.  Hematological: Negative for adenopathy.  Psychiatric/Behavioral: Negative for confusion and dysphoric mood.       Objective:   Physical Exam  Constitutional: He is oriented to person, place, and time. He appears well-developed and well-nourished. No distress.  HENT:  Head: Normocephalic and atraumatic.  Right Ear: External ear normal.  Left Ear: External ear normal.  Mouth/Throat: Oropharynx is clear and moist.  Eyes: Conjunctivae and EOM are normal. Pupils are equal, round, and reactive to light.  Neck: Normal range of motion. Neck supple. No thyromegaly present.  Cardiovascular: Normal rate, regular rhythm and normal heart sounds.  No murmur heard. Pulmonary/Chest: No respiratory distress. He has no wheezes. He has no rales.  Abdominal: Soft. Bowel sounds are normal. He exhibits no distension and no mass. There is no tenderness. There is no rebound and no guarding.  Musculoskeletal: He exhibits no edema.  Lymphadenopathy:    He has no cervical adenopathy.  Neurological: He is alert and oriented to person, place, and time. He displays normal reflexes. No cranial nerve deficit.  Skin: No rash noted.  Psychiatric: He  has a normal mood and affect.       Assessment:     #1 Medicare subsequent annual wellness visit  #2 hypertension stable and a goal  #3 dyslipidemia    Plan:      -Check further labs with basic metabolic panel, lipid panel, hepatic panel -Discussed new shingles vaccine and he is not interested at this time -Needs tetanus booster but explained Medicare will not cover in absence of injury -Other immunizations up-to-date. Colonoscopy up-to-date  Eulas Post MD [facility]

## 2017-03-31 NOTE — Patient Instructions (Signed)
Let me know if you change your mind regarding shingles vaccine.

## 2017-04-01 LAB — BASIC METABOLIC PANEL
BUN: 13 mg/dL (ref 6–23)
CALCIUM: 9.6 mg/dL (ref 8.4–10.5)
CHLORIDE: 103 meq/L (ref 96–112)
CO2: 32 meq/L (ref 19–32)
CREATININE: 1.01 mg/dL (ref 0.40–1.50)
GFR: 77.28 mL/min (ref >60.00)
Glucose, Bld: 94 mg/dL (ref 70–99)
Potassium: 4.2 mEq/L (ref 3.5–5.1)
Sodium: 141 mEq/L (ref 135–145)

## 2017-04-01 LAB — HEPATIC FUNCTION PANEL
ALT: 13 U/L (ref 0–53)
AST: 16 U/L (ref 0–37)
Albumin: 4.3 g/dL (ref 3.5–5.2)
Alkaline Phosphatase: 32 U/L — ABNORMAL LOW (ref 39–117)
BILIRUBIN DIRECT: 0.1 mg/dL (ref 0.0–0.3)
BILIRUBIN TOTAL: 0.6 mg/dL (ref 0.2–1.2)
Total Protein: 6.3 g/dL (ref 6.0–8.3)

## 2017-04-01 LAB — LIPID PANEL
Cholesterol: 151 mg/dL (ref 0–200)
HDL: 54.4 mg/dL (ref >39.00)
LDL CALC: 65 mg/dL (ref 0–99)
NonHDL: 96.38
Total CHOL/HDL Ratio: 3
Triglycerides: 158 mg/dL — ABNORMAL HIGH (ref 0.0–149.0)
VLDL: 31.6 mg/dL (ref 0.0–40.0)

## 2017-06-29 ENCOUNTER — Other Ambulatory Visit: Payer: Self-pay | Admitting: Family Medicine

## 2018-02-03 ENCOUNTER — Telehealth: Payer: Self-pay | Admitting: Family Medicine

## 2018-02-03 MED ORDER — ROSUVASTATIN CALCIUM 5 MG PO TABS: 5.0000 mg | ORAL_TABLET | Freq: Every day | ORAL | 0 refills | Status: DC

## 2018-02-03 MED ORDER — RAMIPRIL 5 MG PO CAPS: 5.0000 mg | ORAL_CAPSULE | Freq: Every day | ORAL | 0 refills | Status: DC

## 2018-02-03 NOTE — Telephone Encounter (Signed)
Copied from [place] [id number]. Topic: Quick Communication - Rx Refill/Question >> Feb 03, 2018  8:59 AM Bea Graff, NT wrote: Medication: ramipril (ALTACE) 5 MG capsule and rosuvastatin (CRESTOR) 5 MG tablet   Has the patient contacted their pharmacy? Yes.   (Agent: If no, request that the patient contact the pharmacy for the refill.) (Agent: If yes, when and what did the pharmacy advise?)  Preferred Pharmacy (with phone number or street name): [facility] [place], [place] [facility] [phone] (Phone) [phone] (Fax)    Agent: Please be advised that RX refills may take up to 3 business days. We ask that you follow-up with your pharmacy.

## 2018-04-01 ENCOUNTER — Other Ambulatory Visit: Payer: Self-pay

## 2018-04-01 ENCOUNTER — Encounter: Payer: Self-pay | Admitting: Family Medicine

## 2018-04-01 ENCOUNTER — Ambulatory Visit (INDEPENDENT_AMBULATORY_CARE_PROVIDER_SITE_OTHER): Payer: Medicare Other | Admitting: Family Medicine

## 2018-04-01 VITALS — BP 134/84 | HR 72 | Temp 97.5°F | Ht 65.0 in | Wt 149.4 lb

## 2018-04-01 DIAGNOSIS — I10 Essential (primary) hypertension: Secondary | ICD-10-CM | POA: Diagnosis not present

## 2018-04-01 DIAGNOSIS — E785 Hyperlipidemia, unspecified: Secondary | ICD-10-CM

## 2018-04-01 DIAGNOSIS — N529 Male erectile dysfunction, unspecified: Secondary | ICD-10-CM

## 2018-04-01 LAB — HEPATIC FUNCTION PANEL
ALK PHOS: 37 U/L — AB (ref 39–117)
ALT: 15 U/L (ref 0–53)
AST: 16 U/L (ref 0–37)
Albumin: 4.5 g/dL (ref 3.5–5.2)
BILIRUBIN DIRECT: 0.1 mg/dL (ref 0.0–0.3)
BILIRUBIN TOTAL: 0.7 mg/dL (ref 0.2–1.2)
Total Protein: 6.9 g/dL (ref 6.0–8.3)

## 2018-04-01 LAB — LIPID PANEL
CHOL/HDL RATIO: 3
Cholesterol: 161 mg/dL (ref 0–200)
HDL: 62.1 mg/dL (ref >39.00)
LDL CALC: 80 mg/dL (ref 0–99)
NONHDL: 98.86
TRIGLYCERIDES: 92 mg/dL (ref 0.0–149.0)
VLDL: 18.4 mg/dL (ref 0.0–40.0)

## 2018-04-01 LAB — BASIC METABOLIC PANEL
BUN: 18 mg/dL (ref 6–23)
CALCIUM: 9.6 mg/dL (ref 8.4–10.5)
CHLORIDE: 101 meq/L (ref 96–112)
CO2: 30 meq/L (ref 19–32)
CREATININE: 0.85 mg/dL (ref 0.40–1.50)
GFR: 94.04 mL/min (ref >60.00)
GLUCOSE: 82 mg/dL (ref 70–99)
Potassium: 4.5 mEq/L (ref 3.5–5.1)
Sodium: 140 mEq/L (ref 135–145)

## 2018-04-01 NOTE — Patient Instructions (Signed)
I would like for you you to schedule a Medicare Annual Wellness Visit (AWV).   This is a yearly appointment with our Health Coach Wynetta Fines, RN) and is designed to develop a personalized prevention plan. This is not a head to toe physical, but rather an opportunity to prevent illness based on your current health and risk factors for disease.   Visits usually last 30-60 minutes and include various screenings for hearing, vision, depression, and dementia, falls, and safety concerns. The visit also includes diet and exercise counseling and information about advance directives.   This is also an opportunity to discuss appropriate health maintenance testing such as mammography, colonoscopy, lung cancer screening, and hepatitis C testing.   The AWV is fully covered by Medicare Part B if:  . You have had Part B for over 12 months, AND . You have not had an AWV in the past 12 months .

## 2018-04-01 NOTE — Progress Notes (Signed)
  Subjective:     Patient ID: Paul Burton, male   DOB: 07-01-1945, 72 y.o.   MRN: [id number]  HPI Patient was initially scheduled for Medicare wellness visit.  However, we have encouraged him to set this up with our health coach.  He is here for medical follow-up.  He has hypertension, hyperlipidemia, erectile dysfunction.  His current medications include Altace 5 mg daily, Crestor 5 mg daily and he has been taking aspirin 325 mg daily.  He takes Viagra as needed.  Denies any recent headaches or dizziness.  No chest pains.  No history of cerebrovascular disease or CAD.  Compliant with medications.  Declines flu vaccine.  Declines shingles vaccine.  Tetanus overdue but not covered by Medicare AMB in the absence of injury.  Past Medical History:  Diagnosis Date  . Allergy   . History of chicken pox   . HYPERLIPIDEMIA 02/20/2009  . HYPERTENSION 02/20/2009  . 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 TONSILLECTOMY      reports that he has never smoked. He has never used smokeless tobacco. He reports that he drinks alcohol. He reports that he does not use drugs. family history includes Stroke in his mother. No Known Allergies   Review of Systems  Constitutional: Negative for fatigue and unexpected weight change.  Eyes: Negative for visual disturbance.  Respiratory: Negative for cough, chest tightness and shortness of breath.   Cardiovascular: Negative for chest pain, palpitations and leg swelling.  Endocrine: Negative for polydipsia and polyuria.  Neurological: Negative for dizziness, syncope, weakness, light-headedness and headaches.       Objective:   Physical Exam  Constitutional: He is oriented to person, place, and time. He appears well-developed and well-nourished.  HENT:  Right Ear: External ear normal.  Left Ear: External ear normal.  Mouth/Throat: Oropharynx is clear and moist.  Eyes: Pupils are equal, round,  and reactive to light.  Neck: Neck supple. No thyromegaly present.  Cardiovascular: Normal rate and regular rhythm.  Pulmonary/Chest: Effort normal and breath sounds normal. No respiratory distress. He has no wheezes. He has no rales.  Musculoskeletal: He exhibits no edema.  Neurological: He is alert and oriented to person, place, and time.       Assessment:     #1 hypertension stable and at goal.  Home blood pressures consistently 782U systolic  #2 hyperlipidemia treated with Crestor  #3 erectile dysfunction    Plan:     -Flu vaccine offered and declined -Encouraged to set up Medicare wellness visit with our health coach -Discussed shingles vaccine and he declines -Continue current medications. -Check labs with lipid, hepatic, basic metabolic panel -Discussed current American Heart Association guidelines regarding aspirin.  He has no clear benefit for use after age 1 and will consider tapering off  Eulas Post MD [facility] at [facility]

## 2018-05-16 ENCOUNTER — Other Ambulatory Visit: Payer: Self-pay | Admitting: Family Medicine

## 2018-11-02 ENCOUNTER — Other Ambulatory Visit: Payer: Self-pay | Admitting: Family Medicine

## 2019-03-29 ENCOUNTER — Other Ambulatory Visit: Payer: Self-pay | Admitting: Family Medicine

## 2019-04-02 ENCOUNTER — Other Ambulatory Visit: Payer: Self-pay

## 2019-04-05 ENCOUNTER — Ambulatory Visit (INDEPENDENT_AMBULATORY_CARE_PROVIDER_SITE_OTHER): Payer: Medicare Other | Admitting: Family Medicine

## 2019-04-05 ENCOUNTER — Other Ambulatory Visit: Payer: Self-pay

## 2019-04-05 ENCOUNTER — Encounter: Payer: Self-pay | Admitting: Family Medicine

## 2019-04-05 VITALS — BP 126/76 | HR 47 | Temp 97.7°F | Ht 66.5 in | Wt 153.6 lb

## 2019-04-05 DIAGNOSIS — E785 Hyperlipidemia, unspecified: Secondary | ICD-10-CM | POA: Diagnosis not present

## 2019-04-05 DIAGNOSIS — I10 Essential (primary) hypertension: Secondary | ICD-10-CM | POA: Diagnosis not present

## 2019-04-05 LAB — HEPATIC FUNCTION PANEL
ALT: 17 U/L (ref 0–53)
AST: 19 U/L (ref 0–37)
Albumin: 4.1 g/dL (ref 3.5–5.2)
Alkaline Phosphatase: 34 U/L — ABNORMAL LOW (ref 39–117)
Bilirubin, Direct: 0.1 mg/dL (ref 0.0–0.3)
Total Bilirubin: 0.7 mg/dL (ref 0.2–1.2)
Total Protein: 6.6 g/dL (ref 6.0–8.3)

## 2019-04-05 LAB — LIPID PANEL
Cholesterol: 165 mg/dL (ref 0–200)
HDL: 63.4 mg/dL (ref >39.00)
LDL Cholesterol: 85 mg/dL (ref 0–99)
NonHDL: 102.07
Total CHOL/HDL Ratio: 3
Triglycerides: 86 mg/dL (ref 0.0–149.0)
VLDL: 17.2 mg/dL (ref 0.0–40.0)

## 2019-04-05 LAB — BASIC METABOLIC PANEL
BUN: 13 mg/dL (ref 6–23)
CO2: 30 mEq/L (ref 19–32)
Calcium: 9.5 mg/dL (ref 8.4–10.5)
Chloride: 102 mEq/L (ref 96–112)
Creatinine, Ser: 0.83 mg/dL (ref 0.40–1.50)
GFR: 90.69 mL/min (ref >60.00)
Glucose, Bld: 86 mg/dL (ref 70–99)
Potassium: 4.3 mEq/L (ref 3.5–5.1)
Sodium: 138 mEq/L (ref 135–145)

## 2019-04-05 NOTE — Progress Notes (Signed)
  Subjective:     Patient ID: Paul Burton, male   DOB: May 08, 1946, 73 y.o.   MRN: [id number]  HPI [name] is seen for medical follow-up.  He has hypertension and hyperlipidemia.  He takes ramipril 5 mg daily and Crestor.  No side effects from medications.  No recent chest pains.  He has been doing well with no recent symptoms.  He has some "aches" which he attributes to aging.  Declines flu vaccination  Past Medical History:  Diagnosis Date  . Allergy   . History of chicken pox   . HYPERLIPIDEMIA 02/20/2009  . HYPERTENSION 02/20/2009  . 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 TONSILLECTOMY      reports that he has never smoked. He has never used smokeless tobacco. He reports current alcohol use. He reports that he does not use drugs. family history includes Stroke in his mother. No Known Allergies   Review of Systems  Constitutional: Negative for chills, fatigue and fever.  Eyes: Negative for visual disturbance.  Respiratory: Negative for cough, chest tightness and shortness of breath.   Cardiovascular: Negative for chest pain, palpitations and leg swelling.  Endocrine: Negative for polydipsia and polyuria.  Neurological: Negative for dizziness, syncope, weakness, light-headedness and headaches.       Objective:   Physical Exam Constitutional:      Appearance: He is well-developed.  HENT:     Right Ear: External ear normal.     Left Ear: External ear normal.  Eyes:     Pupils: Pupils are equal, round, and reactive to light.  Neck:     Musculoskeletal: Neck supple.     Thyroid: No thyromegaly.  Cardiovascular:     Rate and Rhythm: Normal rate and regular rhythm.  Pulmonary:     Effort: Pulmonary effort is normal. No respiratory distress.     Breath sounds: Normal breath sounds. No wheezing or rales.  Musculoskeletal:     Right lower leg: No edema.     Left lower leg: No edema.  Neurological:     Mental  Status: He is alert and oriented to person, place, and time.        Assessment:     #1 hypertension stable and at goal  #2 hyperlipidemia treated with Crestor    Plan:     -Check labs with basic metabolic panel, lipid panel, hepatic panel -Patient just received refills of medication last week -Recommend consideration of flu vaccine but he declines -We will plan yearly follow-up unless indicated otherwise  Eulas Post MD [facility]

## 2019-04-07 ENCOUNTER — Other Ambulatory Visit: Payer: Self-pay

## 2019-06-14 ENCOUNTER — Other Ambulatory Visit: Payer: Self-pay | Admitting: Family Medicine

## 2019-06-26 ENCOUNTER — Ambulatory Visit: Payer: Medicare Other | Attending: Internal Medicine

## 2019-06-26 DIAGNOSIS — Z23 Encounter for immunization: Secondary | ICD-10-CM

## 2019-06-26 NOTE — Progress Notes (Signed)
   Covid-19 Vaccination Clinic  Name:  Paul Burton    MRN: [id number] DOB: 18-Apr-1946  06/26/2019  [name] was observed post Covid-19 immunization for 15 minutes without incidence. He was provided with Vaccine Information Sheet and instruction to access the V-Safe system.   [name] was instructed to call 911 with any severe reactions post vaccine: Marland Kitchen Difficulty breathing  . Swelling of your face and throat  . A fast heartbeat  . A bad rash all over your body  . Dizziness and weakness    Immunizations Administered    Name Date Dose VIS Date Route   Pfizer COVID-19 Vaccine 06/26/2019  9:14 AM 0.3 mL 04/23/2019 Intramuscular   Manufacturer: [place]   Lot: [id number]   [place]: [id number]

## 2019-07-05 ENCOUNTER — Other Ambulatory Visit: Payer: Self-pay

## 2019-07-05 ENCOUNTER — Ambulatory Visit (INDEPENDENT_AMBULATORY_CARE_PROVIDER_SITE_OTHER): Payer: Medicare Other | Admitting: *Deleted

## 2019-07-05 DIAGNOSIS — Z0000 Encounter for general adult medical examination without abnormal findings: Secondary | ICD-10-CM

## 2019-07-05 NOTE — Patient Instructions (Addendum)
Please schedule your next medicare wellness visit with me in 1 yr.  Continue to eat heart healthy diet (full of fruits, vegetables, whole grains, lean protein, water--limit salt, fat, and sugar intake) and increase physical activity as tolerated.  Continue doing brain stimulating activities (puzzles, reading, adult coloring books, staying active) to keep memory sharp.   Bring a copy of your living will and/or healthcare power of attorney to your next office visit.    [name] , Thank you for taking time to come for your Medicare Wellness Visit. I appreciate your ongoing commitment to your health goals. Please review the following plan we discussed and let me know if I can assist you in the future.   These are the goals we discussed: Goals    . Patient Stated     Maintain healthy active lifestyle.        This is a list of the screening recommended for you and due dates:  Health Maintenance  Topic Date Due  .  Hepatitis C: One time screening is recommended by Center for Disease Control  (CDC) for  adults born from [birth year] through 1965.   [birth year]  . Tetanus Vaccine  05/13/2016  . Colon Cancer Screening  02/06/2020  . Pneumonia vaccines  Completed  . Flu Shot  Discontinued    Preventive Care 74 Years and Older, Male Preventive care refers to lifestyle choices and visits with your health care provider that can promote health and wellness. This includes:  A yearly physical exam. This is also called an annual well check.  Regular dental and eye exams.  Immunizations.  Screening for certain conditions.  Healthy lifestyle choices, such as diet and exercise. What can I expect for my preventive care visit? Physical exam Your health care provider will check:  Height and weight. These may be used to calculate body mass index (BMI), which is a measurement that tells if you are at a healthy weight.  Heart rate and blood pressure.  Your skin for abnormal  spots. Counseling Your health care provider may ask you questions about:  Alcohol, tobacco, and drug use.  Emotional well-being.  Home and relationship well-being.  Sexual activity.  Eating habits.  History of falls.  Memory and ability to understand (cognition).  Work and work Statistician. What immunizations do I need?  Influenza (flu) vaccine  This is recommended every year. Tetanus, diphtheria, and pertussis (Tdap) vaccine  You may need a Td booster every 10 years. Varicella (chickenpox) vaccine  You may need this vaccine if you have not already been vaccinated. Zoster (shingles) vaccine  You may need this after age 62. Pneumococcal conjugate (PCV13) vaccine  One dose is recommended after age 71. Pneumococcal polysaccharide (PPSV23) vaccine  One dose is recommended after age 7. Measles, mumps, and rubella (MMR) vaccine  You may need at least one dose of MMR if you were born in 1957 or later. You may also need a second dose. Meningococcal conjugate (MenACWY) vaccine  You may need this if you have certain conditions. Hepatitis A vaccine  You may need this if you have certain conditions or if you travel or work in places where you may be exposed to hepatitis A. Hepatitis B vaccine  You may need this if you have certain conditions or if you travel or work in places where you may be exposed to hepatitis B. Haemophilus influenzae type b (Hib) vaccine  You may need this if you have certain conditions. You may receive vaccines as individual  doses or as more than one vaccine together in one shot (combination vaccines). Talk with your health care provider about the risks and benefits of combination vaccines. What tests do I need? Blood tests  Lipid and cholesterol levels. These may be checked every 5 years, or more frequently depending on your overall health.  Hepatitis C test.  Hepatitis B test. Screening  Lung cancer screening. You may have this screening  every year starting at age 1 if you have a 30-pack-year history of smoking and currently smoke or have quit within the past 15 years.  Colorectal cancer screening. All adults should have this screening starting at age 72 and continuing until age 62. Your health care provider may recommend screening at age 24 if you are at increased risk. You will have tests every 1-10 years, depending on your results and the type of screening test.  Prostate cancer screening. Recommendations will vary depending on your family history and other risks.  Diabetes screening. This is done by checking your blood sugar (glucose) after you have not eaten for a while (fasting). You may have this done every 1-3 years.  Abdominal aortic aneurysm (AAA) screening. You may need this if you are a current or former smoker.  Sexually transmitted disease (STD) testing. Follow these instructions at home: Eating and drinking  Eat a diet that includes fresh fruits and vegetables, whole grains, lean protein, and low-fat dairy products. Limit your intake of foods with high amounts of sugar, saturated fats, and salt.  Take vitamin and mineral supplements as recommended by your health care provider.  Do not drink alcohol if your health care provider tells you not to drink.  If you drink alcohol: ? Limit how much you have to 0-2 drinks a day. ? Be aware of how much alcohol is in your drink. In the [place] one drink equals one 12 oz bottle of beer (355 mL), one 5 oz glass of wine (148 mL), or one 1 oz glass of hard liquor (44 mL). Lifestyle  Take daily care of your teeth and gums.  Stay active. Exercise for at least 30 minutes on 5 or more days each week.  Do not use any products that contain nicotine or tobacco, such as cigarettes, e-cigarettes, and chewing tobacco. If you need help quitting, ask your health care provider.  If you are sexually active, practice safe sex. Use a condom or other form of protection to prevent STIs  (sexually transmitted infections).  Talk with your health care provider about taking a low-dose aspirin or statin. What's next?  Visit your health care provider once a year for a well check visit.  Ask your health care provider how often you should have your eyes and teeth checked.  Stay up to date on all vaccines. This information is not intended to replace advice given to you by your health care provider. Make sure you discuss any questions you have with your health care provider. Document Revised: 04/23/2018 Document Reviewed: 04/23/2018 Elsevier Patient Education  2020 [employer].

## 2019-07-05 NOTE — Progress Notes (Signed)
Virtual Visit via Audio Note  I connected with patient on 07/05/19 at  9:45 AM EST by audio enabled telemedicine application and verified that I am speaking with the correct person using two identifiers.   THIS ENCOUNTER IS A VIRTUAL VISIT DUE TO COVID-19 - PATIENT WAS NOT SEEN IN THE OFFICE. PATIENT HAS CONSENTED TO VIRTUAL VISIT / TELEMEDICINE VISIT   Location of patient: home  Location of provider: office  I discussed the limitations of evaluation and management by telemedicine and the availability of in person appointments. The patient expressed understanding and agreed to proceed.   Subjective:   Paul Burton is a 74 y.o. male who presents for Medicare Annual/Subsequent preventive examination.  Pt enjoys reading and writing. Pt has a Advertising copywriter. Studies scripture.  Review of Systems:  Home Safety/Smoke Alarms: Feels safe in home. Smoke alarms in place.  Lives w/ wife. Lives in 2 story home. Stays on one level. No troubles w/ stairs.   Male:   CCS- 02/05/10     PSA- No results found for: PSA      Objective:    Vitals:  Unable to assess. This visit is enabled though telemedicine due to Covid 19.  Advanced Directives 07/05/2019  Does Patient Have a Medical Advance Directive? Yes  Type of Paramedic of [place];Living will  Does patient want to make changes to medical advance directive? No - Patient declined  Copy of [place] in Chart? No - copy requested    Tobacco Social History   Tobacco Use  Smoking Status Never Smoker  Smokeless Tobacco Never Used     Counseling given: Not Answered   Clinical Intake: Pain : No/denies pain      Past Medical History:  Diagnosis Date  . Allergy   . History of chicken pox   . HYPERLIPIDEMIA 02/20/2009  . HYPERTENSION 02/20/2009  . 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 TONSILLECTOMY     Family History  Problem  Relation Age of Onset  . Stroke Mother   . Alcohol abuse Neg Hx        family hx  . Cancer Neg Hx        family hx - colon ca  . Hypertension Neg Hx        family hx   Social History   Socioeconomic History  . Marital status: Married    Spouse name: Not on file  . Number of children: Not on file  . Years of education: Not on file  . Highest education level: Not on file  Occupational History  . Not on file  Tobacco Use  . Smoking status: Never Smoker  . Smokeless tobacco: Never Used  Substance and Sexual Activity  . Alcohol use: Yes  . Drug use: No  . Sexual activity: Not on file  Other Topics Concern  . Not on file  Social History Narrative  . Not on file   Social Determinants of Health   Financial Resource Strain: Low Risk   . Difficulty of Paying Living Expenses: Not hard at all  Food Insecurity: No Food Insecurity  . Worried About Charity fundraiser in the Last Year: Never true  . Ran Out of Food in the Last Year: Never true  Transportation Needs: No Transportation Needs  . Lack of Transportation (Medical): No  . Lack of Transportation (Non-Medical): No  Physical Activity:   . Days of  Exercise per Week: Not on file  . Minutes of Exercise per Session: Not on file  Stress:   . Feeling of Stress : Not on file  Social Connections:   . Frequency of Communication with Friends and Family: Not on file  . Frequency of Social Gatherings with Friends and Family: Not on file  . Attends Religious Services: Not on file  . Active Member of Clubs or Organizations: Not on file  . Attends Archivist Meetings: Not on file  . Marital Status: Not on file    Outpatient Encounter Medications as of 07/05/2019  Medication Sig  . Multiple Vitamin (MULTIVITAMIN) tablet Take 1 tablet by mouth daily.   . ramipril (ALTACE) 5 MG capsule TAKE 1 CAPSULE DAILY  . rosuvastatin (CRESTOR) 5 MG tablet TAKE 1 TABLET DAILY  . sildenafil (VIAGRA) 100 MG tablet Take 1 tablet (100 mg  total) by mouth daily as needed.   No facility-administered encounter medications on file as of 07/05/2019.    Activities of Daily Living In your present state of health, do you have any difficulty performing the following activities: 07/05/2019  Hearing? N  Vision? N  Difficulty concentrating or making decisions? N  Walking or climbing stairs? N  Dressing or bathing? N  Doing errands, shopping? N  Preparing Food and eating ? N  Using the Toilet? N  In the past six months, have you accidently leaked urine? N  Do you have problems with loss of bowel control? N  Managing your Medications? N  Managing your Finances? N  Housekeeping or managing your Housekeeping? N  Some recent data might be hidden    Patient Care Team: Eulas Post, MD as PCP - General   Assessment:   This is a routine wellness examination for [name]. Physical assessment deferred to PCP.  Exercise Activities and Dietary recommendations Current Exercise Habits: Home exercise routine, Type of exercise: walking, Time (Minutes): 45, Frequency (Times/Week): 4, Weekly Exercise (Minutes/Week): 180, Intensity: Mild, Exercise limited by: None identified Diet (meal preparation, eat out, water intake, caffeinated beverages, dairy products, fruits and vegetables): in general, a "healthy" diet  , well balanced Breakfast: whole grains and fruit Lunch: tuna and fruit Dinner:  Meat and veggies  Goals    . Patient Stated     Maintain healthy active lifestyle.        Fall Risk Fall Risk  07/05/2019 04/07/2019 04/01/2018 03/31/2017 03/13/2016  Falls in the past year? 0 0 0 No No  Comment - Emmi Telephone Survey: data to providers prior to load - - -  Number falls in past yr: 0 - - - -  Injury with Fall? 0 - - - -  Follow up Education provided;Falls prevention discussed - - - -   Depression Screen PHQ 2/9 Scores 07/05/2019 04/01/2018 03/31/2017 03/13/2016  PHQ - 2 Score 0 0 0 0    Cognitive Function  Ad8 score  reviewed for issues:  Issues making decisions:no  Less interest in hobbies / activities:no  Repeats questions, stories (family complaining):no  Trouble using ordinary gadgets (microwave, computer, phone):no  Forgets the month or year: no  Mismanaging finances: no  Remembering appts:no  Daily problems with thinking and/or memory:no Ad8 score is=0        Immunization History  Administered Date(s) Administered  . PFIZER SARS-COV-2 Vaccination 06/26/2019  . Pneumococcal Conjugate-13 07/28/2013  . Pneumococcal Polysaccharide-23 03/13/2016  . Td 05/13/2006     Screening Tests Health Maintenance  Topic Date Due  .  Hepatitis C Screening  05/24/1945  . TETANUS/TDAP  05/13/2016  . COLONOSCOPY  02/06/2020  . PNA vac Low Risk Adult  Completed  . INFLUENZA VACCINE  Discontinued       Plan:    Please schedule your next medicare wellness visit with me in 1 yr.  Continue to eat heart healthy diet (full of fruits, vegetables, whole grains, lean protein, water--limit salt, fat, and sugar intake) and increase physical activity as tolerated.  Continue doing brain stimulating activities (puzzles, reading, adult coloring books, staying active) to keep memory sharp.   Bring a copy of your living will and/or healthcare power of attorney to your next office visit.    I have personally reviewed and noted the following in the patient's chart:   . Medical and social history . Use of alcohol, tobacco or illicit drugs  . Current medications and supplements . Functional ability and status . Nutritional status . Physical activity . Advanced directives . List of other physicians . Hospitalizations, surgeries, and ER visits in previous 12 months . Vitals . Screenings to include cognitive, depression, and falls . Referrals and appointments  In addition, I have reviewed and discussed with patient certain preventive protocols, quality metrics, and best practice recommendations. A  written personalized care plan for preventive services as well as general preventive health recommendations were provided to patient.     Naaman Plummer [place], [place]  07/05/2019

## 2019-07-19 ENCOUNTER — Ambulatory Visit: Payer: Medicare Other | Attending: Internal Medicine

## 2019-07-19 DIAGNOSIS — Z23 Encounter for immunization: Secondary | ICD-10-CM | POA: Insufficient documentation

## 2019-07-19 NOTE — Progress Notes (Signed)
   Covid-19 Vaccination Clinic  Name:  Paul Burton    MRN: [id number] DOB: 07/31/1945  07/19/2019  Paul Burton was observed post Covid-19 immunization for 15 minutes without incident. He was provided with Vaccine Information Sheet and instruction to access the V-Safe system.   Paul Burton was instructed to call 911 with any severe reactions post vaccine: Marland Kitchen Difficulty breathing  . Swelling of face and throat  . A fast heartbeat  . A bad rash all over body  . Dizziness and weakness   Immunizations Administered    Name Date Dose VIS Date Route   Pfizer COVID-19 Vaccine 07/19/2019 10:23 AM 0.3 mL 04/23/2019 Intramuscular   Manufacturer: [employer]   Lot: [id number]   Central: [id number]

## 2019-09-15 DIAGNOSIS — H0102B Squamous blepharitis left eye, upper and lower eyelids: Secondary | ICD-10-CM | POA: Diagnosis not present

## 2019-09-15 DIAGNOSIS — H2513 Age-related nuclear cataract, bilateral: Secondary | ICD-10-CM | POA: Diagnosis not present

## 2019-09-15 DIAGNOSIS — H0102A Squamous blepharitis right eye, upper and lower eyelids: Secondary | ICD-10-CM | POA: Diagnosis not present

## 2019-11-06 ENCOUNTER — Other Ambulatory Visit: Payer: Self-pay | Admitting: Family Medicine

## 2020-02-11 ENCOUNTER — Ambulatory Visit: Payer: Medicare Other | Admitting: Family Medicine

## 2020-02-18 ENCOUNTER — Ambulatory Visit (INDEPENDENT_AMBULATORY_CARE_PROVIDER_SITE_OTHER): Payer: Medicare Other | Admitting: Family Medicine

## 2020-02-18 ENCOUNTER — Encounter: Payer: Self-pay | Admitting: Family Medicine

## 2020-02-18 ENCOUNTER — Other Ambulatory Visit: Payer: Self-pay

## 2020-02-18 VITALS — BP 142/70 | HR 77 | Temp 97.7°F | Ht 66.5 in | Wt 143.0 lb

## 2020-02-18 DIAGNOSIS — D229 Melanocytic nevi, unspecified: Secondary | ICD-10-CM | POA: Diagnosis not present

## 2020-02-18 DIAGNOSIS — E785 Hyperlipidemia, unspecified: Secondary | ICD-10-CM | POA: Diagnosis not present

## 2020-02-18 DIAGNOSIS — R634 Abnormal weight loss: Secondary | ICD-10-CM

## 2020-02-18 DIAGNOSIS — I10 Essential (primary) hypertension: Secondary | ICD-10-CM

## 2020-02-18 DIAGNOSIS — Z1211 Encounter for screening for malignant neoplasm of colon: Secondary | ICD-10-CM | POA: Diagnosis not present

## 2020-02-18 NOTE — Patient Instructions (Signed)

## 2020-02-18 NOTE — Progress Notes (Signed)
Established Patient Office Visit  Subjective:  Patient ID: AXYL SITZMAN, male    DOB: 1946-03-29  Age: 74 y.o. MRN: [id number]  CC: No chief complaint on file.   HPI Paul Burton presents for medical follow-up.  Paul Burton has history of hypertension and hyperlipidemia.  Is due for follow-up labs.  Has not had flu vaccine and is also due for Tdap but declines both these at this time.  Paul Burton is going way this week to the beach.  His last colonoscopy was over 10 years ago.  Paul Burton had Medicare wellness exam back in February.  Recently had some muscle stiffness in his legs predominately hamstrings.  Paul Burton has been stretching and this seems to be improving.  No myalgias.  Paul Burton has had some weight loss of 10 about pounds over the past year.  Paul Burton states his appetite is fine.  Paul Burton denies any headaches, night sweats, adenopathy, abdominal pain, dysuria, hematuria, or any change in stools.  Past Medical History:  Diagnosis Date  . Allergy   . History of chicken pox   . HYPERLIPIDEMIA 02/20/2009  . HYPERTENSION 02/20/2009  . 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 TONSILLECTOMY      Family History  Problem Relation Age of Onset  . Stroke Mother   . Alcohol abuse Neg Hx        family hx  . Cancer Neg Hx        family hx - colon ca  . Hypertension Neg Hx        family hx    Social History   Socioeconomic History  . Marital status: Married    Spouse name: Not on file  . Number of children: Not on file  . Years of education: Not on file  . Highest education level: Not on file  Occupational History  . Not on file  Tobacco Use  . Smoking status: Never Smoker  . Smokeless tobacco: Never Used  Vaping Use  . Vaping Use: Never used  Substance and Sexual Activity  . Alcohol use: Yes  . Drug use: No  . Sexual activity: Not on file  Other Topics Concern  . Not on file  Social History Narrative  . Not on file   Social Determinants  of Health   Financial Resource Strain: Low Risk   . Difficulty of Paying Living Expenses: Not hard at all  Food Insecurity: No Food Insecurity  . Worried About Charity fundraiser in the Last Year: Never true  . Ran Out of Food in the Last Year: Never true  Transportation Needs: No Transportation Needs  . Lack of Transportation (Medical): No  . Lack of Transportation (Non-Medical): No  Physical Activity:   . Days of Exercise per Week: Not on file  . Minutes of Exercise per Session: Not on file  Stress:   . Feeling of Stress : Not on file  Social Connections:   . Frequency of Communication with Friends and Family: Not on file  . Frequency of Social Gatherings with Friends and Family: Not on file  . Attends Religious Services: Not on file  . Active Member of Clubs or Organizations: Not on file  . Attends Archivist Meetings: Not on file  . Marital Status: Not on file  Intimate Partner Violence:   . Fear of Current or Ex-Partner: Not on file  . Emotionally Abused: Not on file  . Physically Abused:  Not on file  . Sexually Abused: Not on file    Outpatient Medications Prior to Visit  Medication Sig Dispense Refill  . Multiple Vitamin (MULTIVITAMIN) tablet Take 1 tablet by mouth daily.     . ramipril (ALTACE) 5 MG capsule TAKE 1 CAPSULE DAILY 90 capsule 1  . rosuvastatin (CRESTOR) 5 MG tablet TAKE 1 TABLET DAILY 90 tablet 1  . sildenafil (VIAGRA) 100 MG tablet Take 1 tablet (100 mg total) by mouth daily as needed. 10 tablet 5   No facility-administered medications prior to visit.    No Known Allergies  ROS Review of Systems  Constitutional: Positive for unexpected weight change. Negative for appetite change, chills, fatigue and fever.  Eyes: Negative for visual disturbance.  Respiratory: Negative for cough, chest tightness and shortness of breath.   Cardiovascular: Negative for chest pain, palpitations and leg swelling.  Gastrointestinal: Negative for abdominal  pain, diarrhea, nausea and vomiting.  Endocrine: Negative for polydipsia and polyuria.  Genitourinary: Negative for dysuria.  Neurological: Negative for dizziness, syncope, weakness, light-headedness and headaches.  Hematological: Negative for adenopathy.  Psychiatric/Behavioral: Negative for dysphoric mood.      Objective:    Physical Exam Vitals reviewed.  Constitutional:      Appearance: Normal appearance.  Cardiovascular:     Rate and Rhythm: Normal rate and regular rhythm.  Pulmonary:     Effort: Pulmonary effort is normal.     Breath sounds: Normal breath sounds.  Abdominal:     Palpations: Abdomen is soft. There is no mass.     Tenderness: There is no abdominal tenderness.  Musculoskeletal:     Cervical back: Neck supple.  Lymphadenopathy:     Cervical: No cervical adenopathy.  Skin:    Findings: No rash.     Comments: Multiple nevi trunk.    Neurological:     General: No focal deficit present.     Mental Status: Paul Burton is alert.     BP (!) 142/70 (BP Location: Left Arm, Patient Position: Sitting, Cuff Size: Normal)   Pulse 77   Temp 97.7 F (36.5 C) (Oral)   Ht 5' 6.5" (1.689 m)   Wt 143 lb (64.9 kg)   SpO2 97%   BMI 22.74 kg/m  Wt Readings from Last 3 Encounters:  02/18/20 143 lb (64.9 kg)  04/05/19 153 lb 9.6 oz (69.7 kg)  04/01/18 149 lb 6.4 oz (67.8 kg)     Health Maintenance Due  Topic Date Due  . Hepatitis C Screening  Never done  . TETANUS/TDAP  05/13/2016  . COLONOSCOPY  02/06/2020    There are no preventive care reminders to display for this patient.  No results found for: TSH No results found for: WBC, HGB, HCT, MCV, PLT Lab Results  Component Value Date   NA 138 04/05/2019   K 4.3 04/05/2019   CO2 30 04/05/2019   GLUCOSE 86 04/05/2019   BUN 13 04/05/2019   CREATININE 0.83 04/05/2019   BILITOT 0.7 04/05/2019   ALKPHOS 34 (L) 04/05/2019   AST 19 04/05/2019   ALT 17 04/05/2019   PROT 6.6 04/05/2019   ALBUMIN 4.1 04/05/2019    CALCIUM 9.5 04/05/2019   GFR 90.69 04/05/2019   Lab Results  Component Value Date   CHOL 165 04/05/2019   Lab Results  Component Value Date   HDL 63.40 04/05/2019   Lab Results  Component Value Date   LDLCALC 85 04/05/2019   Lab Results  Component Value Date   TRIG 86.0  04/05/2019   Lab Results  Component Value Date   CHOLHDL 3 04/05/2019   No results found for: HGBA1C    Assessment & Plan:   Problem List Items Addressed This Visit      Unprioritized   Hyperlipidemia   Relevant Orders   Lipid panel   Hepatic function panel   Essential hypertension - Primary   Relevant Orders   Basic metabolic panel    Other Visit Diagnoses    Colon cancer screening       Relevant Orders   Cologuard   Weight loss       Relevant Orders   CBC with Differential/Platelet   TSH   Sedimentation rate    Paul Burton has had about 10 pounds of unintentional weight loss since last year of uncertain significance.  Paul Burton does relate good appetite.   Obtain lab work including lipids, hepatic panel, basic metabolic panel, CBC, TSH, sed rate  Recommend follow-up with in 2 to 3 months to reassess weight  Set up Cologuard after long discussion of colonoscopy versus Cologuard.  Paul Burton would like to go Cologuard route.  Paul Burton has multiple nevi and we will set up dermatology referral for further assessment  No orders of the defined types were placed in this encounter.   Follow-up: No follow-ups on file.    Carolann Littler, MD

## 2020-02-19 LAB — BASIC METABOLIC PANEL
BUN: 13 mg/dL (ref 7–25)
CO2: 29 mmol/L (ref 20–32)
Calcium: 9.4 mg/dL (ref 8.6–10.3)
Chloride: 99 mmol/L (ref 98–110)
Creat: 0.77 mg/dL (ref 0.70–1.18)
Glucose, Bld: 93 mg/dL (ref 65–99)
Potassium: 4.2 mmol/L (ref 3.5–5.3)
Sodium: 136 mmol/L (ref 135–146)

## 2020-02-19 LAB — HEPATIC FUNCTION PANEL
AG Ratio: 1.5 (calc) (ref 1.0–2.5)
ALT: 11 U/L (ref 9–46)
AST: 15 U/L (ref 10–35)
Albumin: 4 g/dL (ref 3.6–5.1)
Alkaline phosphatase (APISO): 58 U/L (ref 35–144)
Bilirubin, Direct: 0.2 mg/dL (ref 0.0–0.2)
Globulin: 2.6 g/dL (calc) (ref 1.9–3.7)
Indirect Bilirubin: 0.4 mg/dL (calc) (ref 0.2–1.2)
Total Bilirubin: 0.6 mg/dL (ref 0.2–1.2)
Total Protein: 6.6 g/dL (ref 6.1–8.1)

## 2020-02-19 LAB — CBC WITH DIFFERENTIAL/PLATELET
Absolute Monocytes: 637 cells/uL (ref 200–950)
Basophils Absolute: 30 cells/uL (ref 0–200)
Basophils Relative: 0.5 %
Eosinophils Absolute: 89 cells/uL (ref 15–500)
Eosinophils Relative: 1.5 %
HCT: 36.2 % — ABNORMAL LOW (ref 38.5–50.0)
Hemoglobin: 12 g/dL — ABNORMAL LOW (ref 13.2–17.1)
Lymphs Abs: 820 cells/uL — ABNORMAL LOW (ref 850–3900)
MCH: 30.1 pg (ref 27.0–33.0)
MCHC: 33.1 g/dL (ref 32.0–36.0)
MCV: 90.7 fL (ref 80.0–100.0)
MPV: 9.1 fL (ref 7.5–12.5)
Monocytes Relative: 10.8 %
Neutro Abs: 4325 cells/uL (ref 1500–7800)
Neutrophils Relative %: 73.3 %
Platelets: 362 10*3/uL (ref 140–400)
RBC: 3.99 10*6/uL — ABNORMAL LOW (ref 4.20–5.80)
RDW: 12.2 % (ref 11.0–15.0)
Total Lymphocyte: 13.9 %
WBC: 5.9 10*3/uL (ref 3.8–10.8)

## 2020-02-19 LAB — LIPID PANEL
Cholesterol: 134 mg/dL (ref <200)
HDL: 52 mg/dL (ref >=40)
LDL Cholesterol (Calc): 67 mg/dL (calc)
Non-HDL Cholesterol (Calc): 82 mg/dL (calc) (ref <130)
Total CHOL/HDL Ratio: 2.6 (calc) (ref <5.0)
Triglycerides: 69 mg/dL (ref <150)

## 2020-02-19 LAB — TSH: TSH: 2.91 mIU/L (ref 0.40–4.50)

## 2020-02-19 LAB — SEDIMENTATION RATE: Sed Rate: 55 mm/h — ABNORMAL HIGH (ref 0–20)

## 2020-02-20 NOTE — Addendum Note (Signed)
Addended by: Eulas Post on: 02/20/2020 03:42 PM   Modules accepted: Orders

## 2020-02-21 ENCOUNTER — Telehealth: Payer: Self-pay | Admitting: Family Medicine

## 2020-02-21 DIAGNOSIS — D582 Other hemoglobinopathies: Secondary | ICD-10-CM

## 2020-02-21 NOTE — Telephone Encounter (Signed)
LMOVM for pt to plz call the office to schedule a lab visit for additional labs and CXR per BB result note which pt viewed via his MyChart/future lab orders for Fe,TIBC, B12 entered/thx dmf

## 2020-02-21 NOTE — Telephone Encounter (Signed)
-----   Message from Eulas Post, MD sent at 02/20/2020  3:42 PM EDT ----- Labs significant for elevated sed rate and low hgb.   No recent hgb for comparison.  Recommend follow up for ferritin, serum iron/tibc, B12, and PA and Lat CXR.   I will place order for CXR which he could schedule for here.

## 2020-02-29 ENCOUNTER — Encounter: Payer: Self-pay | Admitting: Family Medicine

## 2020-02-29 DIAGNOSIS — Z1212 Encounter for screening for malignant neoplasm of rectum: Secondary | ICD-10-CM | POA: Diagnosis not present

## 2020-02-29 DIAGNOSIS — Z1211 Encounter for screening for malignant neoplasm of colon: Secondary | ICD-10-CM | POA: Diagnosis not present

## 2020-03-01 ENCOUNTER — Other Ambulatory Visit (INDEPENDENT_AMBULATORY_CARE_PROVIDER_SITE_OTHER): Payer: Medicare Other

## 2020-03-01 ENCOUNTER — Other Ambulatory Visit: Payer: Self-pay

## 2020-03-01 ENCOUNTER — Other Ambulatory Visit: Payer: Self-pay | Admitting: Family Medicine

## 2020-03-01 ENCOUNTER — Ambulatory Visit (INDEPENDENT_AMBULATORY_CARE_PROVIDER_SITE_OTHER): Payer: Medicare Other

## 2020-03-01 DIAGNOSIS — R634 Abnormal weight loss: Secondary | ICD-10-CM

## 2020-03-01 DIAGNOSIS — D582 Other hemoglobinopathies: Secondary | ICD-10-CM

## 2020-03-02 LAB — IRON,TIBC AND FERRITIN PANEL
%SAT: 26 % (calc) (ref 20–48)
Ferritin: 219 ng/mL (ref 24–380)
Iron: 85 ug/dL (ref 50–180)
TIBC: 330 mcg/dL (calc) (ref 250–425)

## 2020-03-02 LAB — VITAMIN B12: Vitamin B-12: 374 pg/mL (ref 200–1100)

## 2020-03-11 ENCOUNTER — Ambulatory Visit: Payer: Medicare Other | Attending: Internal Medicine

## 2020-03-11 DIAGNOSIS — Z23 Encounter for immunization: Secondary | ICD-10-CM

## 2020-03-11 LAB — COLOGUARD: Cologuard: NEGATIVE

## 2020-03-11 NOTE — Progress Notes (Signed)
   Covid-19 Vaccination Clinic  Name:  Paul Burton    MRN: [id number] DOB: 05-22-1945  03/11/2020  [name] was observed post Covid-19 immunization for 15 minutes without incident. He was provided with Vaccine Information Sheet and instruction to access the V-Safe system.   [name] was instructed to call 911 with any severe reactions post vaccine: Marland Kitchen Difficulty breathing  . Swelling of face and throat  . A fast heartbeat  . A bad rash all over body  . Dizziness and weakness

## 2020-03-13 ENCOUNTER — Other Ambulatory Visit: Payer: Self-pay

## 2020-03-13 ENCOUNTER — Ambulatory Visit (INDEPENDENT_AMBULATORY_CARE_PROVIDER_SITE_OTHER): Payer: Medicare Other | Admitting: Family Medicine

## 2020-03-13 ENCOUNTER — Encounter: Payer: Self-pay | Admitting: Family Medicine

## 2020-03-13 VITALS — BP 114/68 | HR 72 | Temp 97.7°F | Ht 66.0 in | Wt 143.3 lb

## 2020-03-13 DIAGNOSIS — D649 Anemia, unspecified: Secondary | ICD-10-CM

## 2020-03-13 DIAGNOSIS — R634 Abnormal weight loss: Secondary | ICD-10-CM

## 2020-03-13 DIAGNOSIS — I10 Essential (primary) hypertension: Secondary | ICD-10-CM | POA: Diagnosis not present

## 2020-03-13 DIAGNOSIS — Z23 Encounter for immunization: Secondary | ICD-10-CM | POA: Diagnosis not present

## 2020-03-13 NOTE — Patient Instructions (Signed)
Let me know if you have not heard about Cologuard results in 2-3 weeks.  Dr Allyson Sabal [facility]- [phone].

## 2020-03-13 NOTE — Progress Notes (Signed)
Established Patient Office Visit  Subjective:  Patient ID: Paul Burton, male    DOB: November 08, 1945  Age: 74 y.o. MRN: [id number]  CC:  Chief Complaint  Patient presents with  . Follow-up    weight loss  ,as not loss any more weight since last visit     HPI Paul Burton presents for follow-up regarding recent weight loss.  He has hypertension history and hyperlipidemia.  We noted last visit his weight was down 10 pounds from a year prior but he denied any appetite changes.  He called back after recent labs and states that he has been recently feeling "cold ".  His TSH was normal.  He has had some more frequent daytime naps.  Denies any recent headache, lymphadenopathy, cough, dyspnea, abdominal pain, change in urine or stool habits.  He has completed Cologuard and this is pending.  Recent labs reviewed with patient.  He had hemoglobin of 12.0 with no recent for comparison.  MCV was 90.  Subsequent B12 and iron levels were normal.  Sed rate was 55.  Other chemistries were normal.  Chest x-ray showed no acute findings.  We made referral to dermatologist because of multiple nevi and he has not been called yet regarding that.  Generally feels well.  Appetite remains stable.  No depression issues.  Past Medical History:  Diagnosis Date  . Allergy   . History of chicken pox   . HYPERLIPIDEMIA 02/20/2009  . HYPERTENSION 02/20/2009  . 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 TONSILLECTOMY      Family History  Problem Relation Age of Onset  . Stroke Mother   . Alcohol abuse Neg Hx        family hx  . Cancer Neg Hx        family hx - colon ca  . Hypertension Neg Hx        family hx    Social History   Socioeconomic History  . Marital status: Married    Spouse name: Not on file  . Number of children: Not on file  . Years of education: Not on file  . Highest education level: Not on file  Occupational History  .  Not on file  Tobacco Use  . Smoking status: Never Smoker  . Smokeless tobacco: Never Used  Vaping Use  . Vaping Use: Never used  Substance and Sexual Activity  . Alcohol use: Not Currently  . Drug use: No  . Sexual activity: Not on file  Other Topics Concern  . Not on file  Social History Narrative  . Not on file   Social Determinants of Health   Financial Resource Strain: Low Risk   . Difficulty of Paying Living Expenses: Not hard at all  Food Insecurity: No Food Insecurity  . Worried About Charity fundraiser in the Last Year: Never true  . Ran Out of Food in the Last Year: Never true  Transportation Needs: No Transportation Needs  . Lack of Transportation (Medical): No  . Lack of Transportation (Non-Medical): No  Physical Activity:   . Days of Exercise per Week: Not on file  . Minutes of Exercise per Session: Not on file  Stress:   . Feeling of Stress : Not on file  Social Connections:   . Frequency of Communication with Friends and Family: Not on file  . Frequency of Social Gatherings with Friends and Family: Not on file  .  Attends Religious Services: Not on file  . Active Member of Clubs or Organizations: Not on file  . Attends Archivist Meetings: Not on file  . Marital Status: Not on file  Intimate Partner Violence:   . Fear of Current or Ex-Partner: Not on file  . Emotionally Abused: Not on file  . Physically Abused: Not on file  . Sexually Abused: Not on file    Outpatient Medications Prior to Visit  Medication Sig Dispense Refill  . Multiple Vitamin (MULTIVITAMIN) tablet Take 1 tablet by mouth daily.     . ramipril (ALTACE) 5 MG capsule TAKE 1 CAPSULE DAILY 90 capsule 1  . rosuvastatin (CRESTOR) 5 MG tablet TAKE 1 TABLET DAILY 90 tablet 1   No facility-administered medications prior to visit.    No Known Allergies  ROS Review of Systems  Constitutional: Negative for appetite change, chills and fever.  HENT: Negative for trouble swallowing.    Respiratory: Negative for cough and shortness of breath.   Cardiovascular: Negative for chest pain.  Gastrointestinal: Negative for abdominal pain.  Genitourinary: Negative for dysuria and hematuria.  Hematological: Negative for adenopathy.      Objective:    Physical Exam Vitals reviewed.  Constitutional:      Appearance: Normal appearance.  Cardiovascular:     Rate and Rhythm: Normal rate and regular rhythm.  Pulmonary:     Effort: Pulmonary effort is normal.     Breath sounds: Normal breath sounds.  Abdominal:     Palpations: Abdomen is soft.     Tenderness: There is no abdominal tenderness.  Musculoskeletal:     Cervical back: Neck supple.     Right lower leg: No edema.     Left lower leg: No edema.  Lymphadenopathy:     Cervical: No cervical adenopathy.  Neurological:     Mental Status: He is alert.     BP 114/68 (BP Location: Left Arm, Patient Position: Sitting, Cuff Size: Normal)   Pulse 72   Temp 97.7 F (36.5 C) (Oral)   Ht 5\' 6"  (1.676 m)   Wt 143 lb 4.8 oz (65 kg)   SpO2 93%   BMI 23.13 kg/m  Wt Readings from Last 3 Encounters:  03/13/20 143 lb 4.8 oz (65 kg)  02/18/20 143 lb (64.9 kg)  04/05/19 153 lb 9.6 oz (69.7 kg)     Health Maintenance Due  Topic Date Due  . Hepatitis C Screening  Never done  . TETANUS/TDAP  05/13/2016  . COLONOSCOPY  02/06/2020    There are no preventive care reminders to display for this patient.  Lab Results  Component Value Date   TSH 2.91 02/18/2020   Lab Results  Component Value Date   WBC 5.9 02/18/2020   HGB 12.0 (L) 02/18/2020   HCT 36.2 (L) 02/18/2020   MCV 90.7 02/18/2020   PLT 362 02/18/2020   Lab Results  Component Value Date   NA 136 02/18/2020   K 4.2 02/18/2020   CO2 29 02/18/2020   GLUCOSE 93 02/18/2020   BUN 13 02/18/2020   CREATININE 0.77 02/18/2020   BILITOT 0.6 02/18/2020   ALKPHOS 34 (L) 04/05/2019   AST 15 02/18/2020   ALT 11 02/18/2020   PROT 6.6 02/18/2020   ALBUMIN 4.1  04/05/2019   CALCIUM 9.4 02/18/2020   GFR 90.69 04/05/2019   Lab Results  Component Value Date   CHOL 134 02/18/2020   Lab Results  Component Value Date   HDL 52 02/18/2020  Lab Results  Component Value Date   LDLCALC 67 02/18/2020   Lab Results  Component Value Date   TRIG 69 02/18/2020   Lab Results  Component Value Date   CHOLHDL 2.6 02/18/2020   No results found for: HGBA1C    Assessment & Plan:   #1 hypertension stable and at goal -Continue current medication  #2 recent weight loss.  Etiology unclear.  Encouraging is the fact his weight is not changed any over the past month and his appetite has remained good -Watch closely for any new symptoms -Continue to monitor weight regularly  #3 mild normocytic anemia.  B12 and iron levels normal -Recommend 46-month follow-up and recheck CBC at that point  No orders of the defined types were placed in this encounter.   Follow-up: Return in about 3 months (around 06/13/2020).    Carolann Littler, MD

## 2020-03-17 ENCOUNTER — Other Ambulatory Visit: Payer: Self-pay

## 2020-03-31 ENCOUNTER — Other Ambulatory Visit: Payer: Self-pay | Admitting: Family Medicine

## 2020-04-20 ENCOUNTER — Encounter: Payer: Self-pay | Admitting: Family Medicine

## 2020-04-20 NOTE — Progress Notes (Signed)
Outside notes received. Information abstracted. Notes sent to scan.  

## 2020-06-23 ENCOUNTER — Encounter: Payer: Self-pay | Admitting: Gastroenterology

## 2020-07-03 ENCOUNTER — Telehealth: Payer: Self-pay | Admitting: Family Medicine

## 2020-07-03 NOTE — Telephone Encounter (Signed)
Left message for patient to call back and schedule Medicare Annual Wellness Visit (AWV) either virtually or in office. No detailed message left   Last AWV 07/05/19 please schedule at anytime with [facility] Nurse Health Advisor 1 or 2   This should be a 45 minute visit. 

## 2020-10-04 ENCOUNTER — Telehealth: Payer: Self-pay | Admitting: Family Medicine

## 2020-10-04 NOTE — Telephone Encounter (Signed)
Left message for patient to call back and schedule Medicare Annual Wellness Visit (AWV) either virtually or in office.   Last AWV 07/05/19  please schedule at anytime with [facility] Nurse Health Advisor 1 or 2   This should be a 45 minute visit.

## 2020-11-16 ENCOUNTER — Telehealth: Payer: Self-pay | Admitting: Family Medicine

## 2020-11-16 NOTE — Telephone Encounter (Signed)
Left message for patient to call back and schedule Medicare Annual Wellness Visit (AWV) either virtually or in office.   Last AWV 07/05/19  please schedule at anytime with [facility] Nurse Health Advisor 1 or 2   This should be a 45 minute visit.

## 2020-11-21 ENCOUNTER — Ambulatory Visit (INDEPENDENT_AMBULATORY_CARE_PROVIDER_SITE_OTHER): Payer: Medicare Other

## 2020-11-21 DIAGNOSIS — Z0000 Encounter for general adult medical examination without abnormal findings: Secondary | ICD-10-CM | POA: Diagnosis not present

## 2020-11-21 NOTE — Progress Notes (Signed)
Subjective:   Paul Burton is a 75 y.o. male who presents for an Initial Medicare Annual Wellness Visit.   I connected with Paul Burton today by telephone and verified that I am speaking with the correct person using two identifiers. Location patient: home Location provider: work Persons participating in the virtual visit: patient, provider.   I discussed the limitations, risks, security and privacy concerns of performing an evaluation and management service by telephone and the availability of in person appointments. I also discussed with the patient that there may be a patient responsible charge related to this service. The patient expressed understanding and verbally consented to this telephonic visit.    Interactive audio and video telecommunications were attempted between this provider and patient, however failed, due to patient having technical difficulties OR patient did not have access to video capability.  We continued and completed visit with audio only.    Review of Systems    N/a Cardiac Risk Factors include: advanced age (>29men, >65 women);dyslipidemia;male gender     Objective:    Today's Vitals   There is no height or weight on file to calculate BMI.  Advanced Directives 11/21/2020 07/05/2019  Does Patient Have a Medical Advance Directive? Yes Yes  Type of Paramedic of [place];Living will [place];Living will  Does patient want to make changes to medical advance directive? - No - Patient declined  Copy of [place] in Chart? No - copy requested No - copy requested    Current Medications (verified) Outpatient Encounter Medications as of 11/21/2020  Medication Sig   Multiple Vitamin (MULTIVITAMIN) tablet Take 1 tablet by mouth daily.    ramipril (ALTACE) 5 MG capsule TAKE 1 CAPSULE DAILY   rosuvastatin (CRESTOR) 5 MG tablet TAKE 1 TABLET DAILY   No facility-administered encounter  medications on file as of 11/21/2020.    Allergies (verified) Patient has no known allergies.   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   Past Surgical History:  Procedure Laterality Date   TONSILLECTOMY     Family History  Problem Relation Age of Onset   Stroke Mother    Alcohol abuse Neg Hx        family hx   Cancer Neg Hx        family hx - colon ca   Hypertension Neg Hx        family hx   Social History   Socioeconomic History   Marital status: Married    Spouse name: Not on file   Number of children: Not on file   Years of education: Not on file   Highest education level: Not on file  Occupational History   Not on file  Tobacco Use   Smoking status: Never   Smokeless tobacco: Never  Vaping Use   Vaping Use: Never used  Substance and Sexual Activity   Alcohol use: Not Currently   Drug use: No   Sexual activity: Not on file  Other Topics Concern   Not on file  Social History Narrative   Not on file   Social Determinants of Health   Financial Resource Strain: Low Risk    Difficulty of Paying Living Expenses: Not hard at all  Food Insecurity: No Food Insecurity   Worried About Charity fundraiser in the  Last Year: Never true   Ran Out of Food in the Last Year: Never true  Transportation Needs: No Transportation Needs   Lack of Transportation (Medical): No   Lack of Transportation (Non-Medical): No  Physical Activity: Sufficiently Active   Days of Exercise per Week: 6 days   Minutes of Exercise per Session: 60 min  Stress: No Stress Concern Present   Feeling of Stress : Not at all  Social Connections: Moderately Integrated   Frequency of Communication with Friends and Family: Three times a week   Frequency of Social Gatherings with Friends and Family: Three times a week   Attends Religious Services: More than 4  times per year   Active Member of Clubs or Organizations: No   Attends Archivist Meetings: Never   Marital Status: Married    Tobacco Counseling Counseling given: Not Answered   Clinical Intake:  Pre-visit preparation completed: Yes  Pain : No/denies pain     Nutritional Risks: None Diabetes: No  How often do you need to have someone help you when you read instructions, pamphlets, or other written materials from your doctor or pharmacy?: 1 - Never What is the last grade level you completed in school?: masters  Diabetic?no  Interpreter Needed?: No  Information entered by :: [place] of Daily Living In your present state of health, do you have any difficulty performing the following activities: 11/21/2020 11/21/2020  Hearing? N N  Vision? N N  Difficulty concentrating or making decisions? N N  Walking or climbing stairs? N N  Dressing or bathing? N N  Doing errands, shopping? N N  Preparing Food and eating ? N N  Using the Toilet? N N  In the past six months, have you accidently leaked urine? N N  Do you have problems with loss of bowel control? N N  Managing your Medications? N N  Managing your Finances? N N  Housekeeping or managing your Housekeeping? N N  Some recent data might be hidden    Patient Care Team: Eulas Post, MD as PCP - General  Indicate any recent Medical Services you may have received from other than [facility] providers in the past year (date may be approximate).     Assessment:   This is a routine wellness examination for Paul Burton.  Hearing/Vision screen Vision Screening - Comments:: Annual eye exams wears glasses  Dietary issues and exercise activities discussed: Current Exercise Habits: Home exercise routine, Type of exercise: walking, Time (Minutes): 60, Frequency (Times/Week): 7, Weekly Exercise (Minutes/Week): 420, Intensity: Moderate   Goals Addressed   None    Depression Screen PHQ 2/9 Scores  11/21/2020 11/21/2020 07/05/2019 04/01/2018 03/31/2017 03/13/2016 08/08/2014  PHQ - 2 Score 0 0 0 0 0 0 0    Fall Risk Fall Risk  11/21/2020 07/05/2019 04/07/2019 04/01/2018 03/31/2017  Falls in the past year? 0 0 0 0 No  Comment - - Emmi Telephone Survey: data to providers prior to load - -  Number falls in past yr: 0 0 - - -  Injury with Fall? 0 0 - - -  Follow up Falls evaluation completed Education provided;Falls prevention discussed - - -    FALL RISK PREVENTION PERTAINING TO THE HOME:  Any stairs in or around the home? Yes  If so, are there any without handrails? Yes  Home free of loose throw rugs in walkways, pet beds, electrical cords, etc? Yes  Adequate lighting in your home to reduce risk of  falls? Yes   ASSISTIVE DEVICES UTILIZED TO PREVENT FALLS:  Life alert? No  Use of a cane, walker or w/c? No  Grab bars in the bathroom? No  Shower chair or bench in shower? No  Elevated toilet seat or a handicapped toilet? No    Cognitive Function:    Normal cognitive status assessed by direct observation by this Nurse Health Advisor. No abnormalities found.      Immunizations Immunization History  Administered Date(s) Administered   Fluad Quad(high Dose 65+) 03/13/2020   PFIZER(Purple Top)SARS-COV-2 Vaccination 06/26/2019, 07/19/2019, 03/11/2020   Pneumococcal Conjugate-13 07/28/2013   Pneumococcal Polysaccharide-23 03/13/2016   Td 05/13/2006    TDAP status: Due, Education has been provided regarding the importance of this vaccine. Advised may receive this vaccine at local pharmacy or Health Dept. Aware to provide a copy of the vaccination record if obtained from local pharmacy or Health Dept. Verbalized acceptance and understanding.  Flu Vaccine status: Up to date  Pneumococcal vaccine status: Up to date  Covid-19 vaccine status: Completed vaccines  Qualifies for Shingles Vaccine? Yes   Zostavax completed No   Shingrix Completed?: No.    Education has been provided  regarding the importance of this vaccine. Patient has been advised to call insurance company to determine out of pocket expense if they have not yet received this vaccine. Advised may also receive vaccine at local pharmacy or Health Dept. Verbalized acceptance and understanding.  Screening Tests Health Maintenance  Topic Date Due   Hepatitis C Screening  Never done   Zoster Vaccines- Shingrix (1 of 2) Never done   TETANUS/TDAP  05/13/2016   COLONOSCOPY (Pts 45-71yrs Insurance coverage will need to be confirmed)  02/06/2020   COVID-19 Vaccine (4 - Booster for Pfizer series) 07/10/2020   PNA vac Low Risk Adult  Completed   HPV VACCINES  Aged Out   INFLUENZA VACCINE  Discontinued    Health Maintenance  Health Maintenance Due  Topic Date Due   Hepatitis C Screening  Never done   Zoster Vaccines- Shingrix (1 of 2) Never done   TETANUS/TDAP  05/13/2016   COLONOSCOPY (Pts 45-39yrs Insurance coverage will need to be confirmed)  02/06/2020   COVID-19 Vaccine (4 - Booster for [place] series) 07/10/2020    Colorectal cancer screening: No longer required.   Lung Cancer Screening: (Low Dose CT Chest recommended if Age 54-80 years, 30 pack-year currently smoking OR have quit w/in 15years.) does not qualify.   Lung Cancer Screening Referral: n/a  Additional Screening:  Hepatitis C Screening: does qualify  Vision Screening: Recommended annual ophthalmology exams for early detection of glaucoma and other disorders of the eye. Is the patient up to date with their annual eye exam?  Yes  Who is the provider or what is the name of the office in which the patient attends annual eye exams? [facility] If pt is not established with a provider, would they like to be referred to a provider to establish care? No .   Dental Screening: Recommended annual dental exams for proper oral hygiene  Community Resource Referral / Chronic Care Management: CRR required this visit?  No   CCM required  this visit?  No      Plan:     I have personally reviewed and noted the following in the patient's chart:   Medical and social history Use of alcohol, tobacco or illicit drugs  Current medications and supplements including opioid prescriptions. Patient is not currently taking opioid prescriptions. Functional  ability and status Nutritional status Physical activity Advanced directives List of other physicians Hospitalizations, surgeries, and ER visits in previous 12 months Vitals Screenings to include cognitive, depression, and falls Referrals and appointments  In addition, I have reviewed and discussed with patient certain preventive protocols, quality metrics, and best practice recommendations. A written personalized care plan for preventive services as well as general preventive health recommendations were provided to patient.     Randel Pigg, LPN   11/07/3660   Nurse Notes: none

## 2020-11-21 NOTE — Patient Instructions (Signed)
Paul Burton , Thank you for taking time to come for your Medicare Wellness Visit. I appreciate your ongoing commitment to your health goals. Please review the following plan we discussed and let me know if I can assist you in the future.   Screening recommendations/referrals: Colonoscopy: no longer required  Recommended yearly ophthalmology/optometry visit for glaucoma screening and checkup Recommended yearly dental visit for hygiene and checkup  Vaccinations: Influenza vaccine: due in fall 2022 Pneumococcal vaccine: completed series Tdap vaccine: due , will obtain local pharmacy  Shingles vaccine: will obtain local pharmacy   Advanced directives: will provide copies   Conditions/risks identified: none   Next appointment: none   Preventive Care 7 Years and Older, Male Preventive care refers to lifestyle choices and visits with your health care provider that can promote health and wellness. What does preventive care include? A yearly physical exam. This is also called an annual well check. Dental exams once or twice a year. Routine eye exams. Ask your health care provider how often you should have your eyes checked. Personal lifestyle choices, including: Daily care of your teeth and gums. Regular physical activity. Eating a healthy diet. Avoiding tobacco and drug use. Limiting alcohol use. Practicing safe sex. Taking low doses of aspirin every day. Taking vitamin and mineral supplements as recommended by your health care provider. What happens during an annual well check? The services and screenings done by your health care provider during your annual well check will depend on your age, overall health, lifestyle risk factors, and family history of disease. Counseling  Your health care provider may ask you questions about your: Alcohol use. Tobacco use. Drug use. Emotional well-being. Home and relationship well-being. Sexual activity. Eating habits. History of  falls. Memory and ability to understand (cognition). Work and work Statistician. Screening  You may have the following tests or measurements: Height, weight, and BMI. Blood pressure. Lipid and cholesterol levels. These may be checked every 5 years, or more frequently if you are over 88 years old. Skin check. Lung cancer screening. You may have this screening every year starting at age 66 if you have a 30-pack-year history of smoking and currently smoke or have quit within the past 15 years. Fecal occult blood test (FOBT) of the stool. You may have this test every year starting at age 59. Flexible sigmoidoscopy or colonoscopy. You may have a sigmoidoscopy every 5 years or a colonoscopy every 10 years starting at age 36. Prostate cancer screening. Recommendations will vary depending on your family history and other risks. Hepatitis C blood test. Hepatitis B blood test. Sexually transmitted disease (STD) testing. Diabetes screening. This is done by checking your blood sugar (glucose) after you have not eaten for a while (fasting). You may have this done every 1-3 years. Abdominal aortic aneurysm (AAA) screening. You may need this if you are a current or former smoker. Osteoporosis. You may be screened starting at age 35 if you are at high risk. Talk with your health care provider about your test results, treatment options, and if necessary, the need for more tests. Vaccines  Your health care provider may recommend certain vaccines, such as: Influenza vaccine. This is recommended every year. Tetanus, diphtheria, and acellular pertussis (Tdap, Td) vaccine. You may need a Td booster every 10 years. Zoster vaccine. You may need this after age 64. Pneumococcal 13-valent conjugate (PCV13) vaccine. One dose is recommended after age 22. Pneumococcal polysaccharide (PPSV23) vaccine. One dose is recommended after age 17. Talk to your health  care provider about which screenings and vaccines you need and  how often you need them. This information is not intended to replace advice given to you by your health care provider. Make sure you discuss any questions you have with your health care provider. Document Released: 05/26/2015 Document Revised: 01/17/2016 Document Reviewed: 02/28/2015 Elsevier Interactive Patient Education  2017 [place] Prevention in the Home Falls can cause injuries. They can happen to people of all ages. There are many things you can do to make your home safe and to help prevent falls. What can I do on the outside of my home? Regularly fix the edges of walkways and driveways and fix any cracks. Remove anything that might make you trip as you walk through a door, such as a raised step or threshold. Trim any bushes or trees on the path to your home. Use bright outdoor lighting. Clear any walking paths of anything that might make someone trip, such as rocks or tools. Regularly check to see if handrails are loose or broken. Make sure that both sides of any steps have handrails. Any raised decks and porches should have guardrails on the edges. Have any leaves, snow, or ice cleared regularly. Use sand or salt on walking paths during winter. Clean up any spills in your garage right away. This includes oil or grease spills. What can I do in the bathroom? Use night lights. Install grab bars by the toilet and in the tub and shower. Do not use towel bars as grab bars. Use non-skid mats or decals in the tub or shower. If you need to sit down in the shower, use a plastic, non-slip stool. Keep the floor dry. Clean up any water that spills on the floor as soon as it happens. Remove soap buildup in the tub or shower regularly. Attach bath mats securely with double-sided non-slip rug tape. Do not have throw rugs and other things on the floor that can make you trip. What can I do in the bedroom? Use night lights. Make sure that you have a light by your bed that is easy to  reach. Do not use any sheets or blankets that are too big for your bed. They should not hang down onto the floor. Have a firm chair that has side arms. You can use this for support while you get dressed. Do not have throw rugs and other things on the floor that can make you trip. What can I do in the kitchen? Clean up any spills right away. Avoid walking on wet floors. Keep items that you use a lot in easy-to-reach places. If you need to reach something above you, use a strong step stool that has a grab bar. Keep electrical cords out of the way. Do not use floor polish or wax that makes floors slippery. If you must use wax, use non-skid floor wax. Do not have throw rugs and other things on the floor that can make you trip. What can I do with my stairs? Do not leave any items on the stairs. Make sure that there are handrails on both sides of the stairs and use them. Fix handrails that are broken or loose. Make sure that handrails are as long as the stairways. Check any carpeting to make sure that it is firmly attached to the stairs. Fix any carpet that is loose or worn. Avoid having throw rugs at the top or bottom of the stairs. If you do have throw rugs, attach them to  the floor with carpet tape. Make sure that you have a light switch at the top of the stairs and the bottom of the stairs. If you do not have them, ask someone to add them for you. What else can I do to help prevent falls? Wear shoes that: Do not have high heels. Have rubber bottoms. Are comfortable and fit you well. Are closed at the toe. Do not wear sandals. If you use a stepladder: Make sure that it is fully opened. Do not climb a closed stepladder. Make sure that both sides of the stepladder are locked into place. Ask someone to hold it for you, if possible. Clearly mark and make sure that you can see: Any grab bars or handrails. First and last steps. Where the edge of each step is. Use tools that help you move  around (mobility aids) if they are needed. These include: Canes. Walkers. Scooters. Crutches. Turn on the lights when you go into a dark area. Replace any light bulbs as soon as they burn out. Set up your furniture so you have a clear path. Avoid moving your furniture around. If any of your floors are uneven, fix them. If there are any pets around you, be aware of where they are. Review your medicines with your doctor. Some medicines can make you feel dizzy. This can increase your chance of falling. Ask your doctor what other things that you can do to help prevent falls. This information is not intended to replace advice given to you by your health care provider. Make sure you discuss any questions you have with your health care provider. Document Released: 02/23/2009 Document Revised: 10/05/2015 Document Reviewed: 06/03/2014 Elsevier Interactive Patient Education  2017 [employer].

## 2021-01-10 ENCOUNTER — Other Ambulatory Visit: Payer: Self-pay | Admitting: Family Medicine

## 2021-03-08 ENCOUNTER — Other Ambulatory Visit: Payer: Self-pay

## 2021-03-09 ENCOUNTER — Ambulatory Visit (INDEPENDENT_AMBULATORY_CARE_PROVIDER_SITE_OTHER): Payer: Medicare Other

## 2021-03-09 DIAGNOSIS — Z23 Encounter for immunization: Secondary | ICD-10-CM | POA: Diagnosis not present

## 2021-03-23 ENCOUNTER — Encounter: Payer: Self-pay | Admitting: Family Medicine

## 2021-03-23 NOTE — Telephone Encounter (Signed)
Noted  

## 2021-03-27 ENCOUNTER — Encounter: Payer: Self-pay | Admitting: Family Medicine

## 2021-03-27 ENCOUNTER — Ambulatory Visit (INDEPENDENT_AMBULATORY_CARE_PROVIDER_SITE_OTHER): Payer: Medicare Other | Admitting: Family Medicine

## 2021-03-27 VITALS — BP 140/60 | HR 70 | Temp 98.2°F | Ht 66.0 in | Wt 150.4 lb

## 2021-03-27 DIAGNOSIS — E785 Hyperlipidemia, unspecified: Secondary | ICD-10-CM

## 2021-03-27 DIAGNOSIS — D229 Melanocytic nevi, unspecified: Secondary | ICD-10-CM | POA: Diagnosis not present

## 2021-03-27 DIAGNOSIS — I10 Essential (primary) hypertension: Secondary | ICD-10-CM

## 2021-03-27 DIAGNOSIS — D649 Anemia, unspecified: Secondary | ICD-10-CM | POA: Diagnosis not present

## 2021-03-27 LAB — CBC WITH DIFFERENTIAL/PLATELET
Basophils Absolute: 0 10*3/uL (ref 0.0–0.1)
Basophils Relative: 0.7 % (ref 0.0–3.0)
Eosinophils Absolute: 0.1 10*3/uL (ref 0.0–0.7)
Eosinophils Relative: 1.7 % (ref 0.0–5.0)
HCT: 41.4 % (ref 39.0–52.0)
Hemoglobin: 13.7 g/dL (ref 13.0–17.0)
Lymphocytes Relative: 23.7 % (ref 12.0–46.0)
Lymphs Abs: 0.8 10*3/uL (ref 0.7–4.0)
MCHC: 33.1 g/dL (ref 30.0–36.0)
MCV: 93.1 fl (ref 78.0–100.0)
Monocytes Absolute: 0.4 10*3/uL (ref 0.1–1.0)
Monocytes Relative: 11.3 % (ref 3.0–12.0)
Neutro Abs: 2.2 10*3/uL (ref 1.4–7.7)
Neutrophils Relative %: 62.6 % (ref 43.0–77.0)
Platelets: 207 10*3/uL (ref 150.0–400.0)
RBC: 4.45 Mil/uL (ref 4.22–5.81)
RDW: 14.2 % (ref 11.5–15.5)
WBC: 3.5 10*3/uL — ABNORMAL LOW (ref 4.0–10.5)

## 2021-03-27 LAB — HEPATIC FUNCTION PANEL
ALT: 14 U/L (ref 0–53)
AST: 18 U/L (ref 0–37)
Albumin: 4.5 g/dL (ref 3.5–5.2)
Alkaline Phosphatase: 37 U/L — ABNORMAL LOW (ref 39–117)
Bilirubin, Direct: 0.1 mg/dL (ref 0.0–0.3)
Total Bilirubin: 0.7 mg/dL (ref 0.2–1.2)
Total Protein: 7.1 g/dL (ref 6.0–8.3)

## 2021-03-27 LAB — BASIC METABOLIC PANEL
BUN: 20 mg/dL (ref 6–23)
CO2: 31 mEq/L (ref 19–32)
Calcium: 9.6 mg/dL (ref 8.4–10.5)
Chloride: 102 mEq/L (ref 96–112)
Creatinine, Ser: 0.9 mg/dL (ref 0.40–1.50)
GFR: 83.55 mL/min (ref >60.00)
Glucose, Bld: 90 mg/dL (ref 70–99)
Potassium: 4.1 mEq/L (ref 3.5–5.1)
Sodium: 139 mEq/L (ref 135–145)

## 2021-03-27 LAB — LIPID PANEL
Cholesterol: 172 mg/dL (ref 0–200)
HDL: 67.7 mg/dL (ref >39.00)
LDL Cholesterol: 81 mg/dL (ref 0–99)
NonHDL: 104.06
Total CHOL/HDL Ratio: 3
Triglycerides: 113 mg/dL (ref 0.0–149.0)
VLDL: 22.6 mg/dL (ref 0.0–40.0)

## 2021-03-27 NOTE — Progress Notes (Signed)
Established Patient Office Visit  Subjective:  Patient ID: Paul Burton, male    DOB: [birth year]  Age: 75 y.o. MRN: [id number]  CC:  Chief Complaint  Patient presents with   Annual Exam    HPI Paul Burton presents for medical follow-up.  He actually had Medicare wellness visit earlier this year.  He has history of hypertension which is treated with pramipexole.  He is on rosuvastatin for hyperlipidemia.  Last year he had had COVID recently and came in with some unintentional weight loss.  He had normocytic anemia which is mild with normal B12 and normal iron studies.  Cologuard then was negative.  He has gained his weight back and has good appetite at this time and feels back to baseline.  He thinks this was possibly post-COVID related.  It took him some time to regain his energy and strength but he is back walking and back to usual activity levels.  No recent dizziness.  No chest pains.  No dyspnea.  No history of Shingrix vaccine but he declines.  He declines tetanus booster.  Cologuard last year negative.  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    Past Surgical History:  Procedure Laterality Date   TONSILLECTOMY      Family History  Problem Relation Age of Onset   Stroke Mother    Cancer Father 20       rectal cancer   Alcohol abuse Neg Hx        family hx   Hypertension Neg Hx        family hx    Social History   Socioeconomic History   Marital status: Married    Spouse name: Not on file   Number of children: Not on file   Years of education: Not on file   Highest education level: Not on file  Occupational History   Not on file  Tobacco Use   Smoking status: Never   Smokeless tobacco: Never  Vaping Use   Vaping Use: Never used  Substance and Sexual Activity   Alcohol use: Not Currently   Drug use: No   Sexual  activity: Not on file  Other Topics Concern   Not on file  Social History Narrative   Not on file   Social Determinants of Health   Financial Resource Strain: Low Risk    Difficulty of Paying Living Expenses: Not hard at all  Food Insecurity: No Food Insecurity   Worried About Charity fundraiser in the Last Year: Never true   [place] in the Last Year: Never true  Transportation Needs: No Transportation Needs   Lack of Transportation (Medical): No   Lack of Transportation (Non-Medical): No  Physical Activity: Sufficiently Active   Days of Exercise per Week: 6 days   Minutes of Exercise per Session: 60 min  Stress: No Stress Concern Present   Feeling of Stress : Not at all  Social Connections: Moderately Integrated   Frequency of Communication with Friends and Family: Three times a week   Frequency of Social Gatherings with Friends and Family: Three times a week   Attends Religious Services: More than 4 times per year   Active Member of Clubs or Organizations: No   Attends Archivist Meetings: Never   Marital Status: Married  Human resources officer  Violence: Not At Risk   Fear of Current or Ex-Partner: No   Emotionally Abused: No   Physically Abused: No   Sexually Abused: No    Outpatient Medications Prior to Visit  Medication Sig Dispense Refill   Multiple Vitamin (MULTIVITAMIN) tablet Take 1 tablet by mouth daily.      ramipril (ALTACE) 5 MG capsule TAKE 1 CAPSULE DAILY 90 capsule 1   rosuvastatin (CRESTOR) 5 MG tablet TAKE 1 TABLET DAILY 90 tablet 1   No facility-administered medications prior to visit.    No Known Allergies  ROS Review of Systems  Constitutional:  Negative for fatigue and unexpected weight change.  Eyes:  Negative for visual disturbance.  Respiratory:  Negative for cough, chest tightness and shortness of breath.   Cardiovascular:  Negative for chest pain, palpitations and leg swelling.  Endocrine: Negative for polydipsia and  polyuria.  Neurological:  Negative for dizziness, syncope, weakness, light-headedness and headaches.     Objective:    Physical Exam Constitutional:      Appearance: He is well-developed.  HENT:     Right Ear: External ear normal.     Left Ear: External ear normal.  Eyes:     Pupils: Pupils are equal, round, and reactive to light.  Neck:     Thyroid: No thyromegaly.  Cardiovascular:     Rate and Rhythm: Normal rate and regular rhythm.  Pulmonary:     Effort: Pulmonary effort is normal. No respiratory distress.     Breath sounds: Normal breath sounds. No wheezing or rales.  Musculoskeletal:     Cervical back: Neck supple.     Right lower leg: No edema.     Left lower leg: No edema.  Skin:    Comments: Multiple nevi on his trunk especially.  Couple of these have mild atypical features.  Neurological:     Mental Status: He is alert and oriented to person, place, and time.    BP 140/60 (BP Location: Left Arm, Patient Position: Sitting, Cuff Size: Normal)   Pulse 70   Temp 98.2 F (36.8 C) (Oral)   Ht 5\' 6"  (1.676 m)   Wt 150 lb 6.4 oz (68.2 kg)   SpO2 99%   BMI 24.28 kg/m  Wt Readings from Last 3 Encounters:  03/27/21 150 lb 6.4 oz (68.2 kg)  03/13/20 143 lb 4.8 oz (65 kg)  02/18/20 143 lb (64.9 kg)     Health Maintenance Due  Topic Date Due   COVID-19 Vaccine (4 - Booster for Pfizer series) 05/06/2020    There are no preventive care reminders to display for this patient.  Lab Results  Component Value Date   TSH 2.91 02/18/2020   Lab Results  Component Value Date   WBC 5.9 02/18/2020   HGB 12.0 (L) 02/18/2020   HCT 36.2 (L) 02/18/2020   MCV 90.7 02/18/2020   PLT 362 02/18/2020   Lab Results  Component Value Date   NA 136 02/18/2020   K 4.2 02/18/2020   CO2 29 02/18/2020   GLUCOSE 93 02/18/2020   BUN 13 02/18/2020   CREATININE 0.77 02/18/2020   BILITOT 0.6 02/18/2020   ALKPHOS 34 (L) 04/05/2019   AST 15 02/18/2020   ALT 11 02/18/2020   PROT 6.6  02/18/2020   ALBUMIN 4.1 04/05/2019   CALCIUM 9.4 02/18/2020   GFR 90.69 04/05/2019   Lab Results  Component Value Date   CHOL 134 02/18/2020   Lab Results  Component Value Date  HDL 52 02/18/2020   Lab Results  Component Value Date   LDLCALC 67 02/18/2020   Lab Results  Component Value Date   TRIG 69 02/18/2020   Lab Results  Component Value Date   CHOLHDL 2.6 02/18/2020   No results found for: HGBA1C    Assessment & Plan:   Problem List Items Addressed This Visit       Unprioritized   Normocytic anemia   Relevant Orders   CBC with Differential/Platelet   Essential hypertension - Primary   Relevant Orders   Basic metabolic panel   Hyperlipidemia   Relevant Orders   Lipid panel   Hepatic function panel   Other Visit Diagnoses     Multiple nevi       Relevant Orders   Ambulatory referral to Dermatology     -Check labs today with CBC, lipid, hepatic, basic metabolic panel -Continue current medications -Set up dermatology referral to assess his nevi and for good basic skin check -Discussed Shingrix vaccine and he declines -Flu vaccine already given  No orders of the defined types were placed in this encounter.   Follow-up: No follow-ups on file.    Carolann Littler, MD

## 2021-04-11 DIAGNOSIS — Z23 Encounter for immunization: Secondary | ICD-10-CM | POA: Diagnosis not present

## 2021-04-27 DIAGNOSIS — L821 Other seborrheic keratosis: Secondary | ICD-10-CM | POA: Diagnosis not present

## 2021-04-27 DIAGNOSIS — L814 Other melanin hyperpigmentation: Secondary | ICD-10-CM | POA: Diagnosis not present

## 2021-04-27 DIAGNOSIS — D1801 Hemangioma of skin and subcutaneous tissue: Secondary | ICD-10-CM | POA: Diagnosis not present

## 2021-06-18 ENCOUNTER — Other Ambulatory Visit: Payer: Self-pay | Admitting: Family Medicine

## 2021-11-19 ENCOUNTER — Other Ambulatory Visit: Payer: Self-pay | Admitting: Family Medicine

## 2021-11-22 ENCOUNTER — Ambulatory Visit (INDEPENDENT_AMBULATORY_CARE_PROVIDER_SITE_OTHER): Payer: Medicare Other

## 2021-11-22 VITALS — Ht 66.0 in | Wt 150.0 lb

## 2021-11-22 DIAGNOSIS — Z0000 Encounter for general adult medical examination without abnormal findings: Secondary | ICD-10-CM | POA: Diagnosis not present

## 2021-11-22 NOTE — Patient Instructions (Addendum)
[name] , Thank you for taking time to come for your Medicare Wellness Visit. I appreciate your ongoing commitment to your health goals. Please review the following plan we discussed and let me know if I can assist you in the future.   These are the goals we discussed:  Goals       Patient Stated (pt-stated)      Maintain healthy active lifestyle.         This is a list of the screening recommended for you and due dates:  Health Maintenance  Topic Date Due   COVID-19 Vaccine (4 - Pfizer series) 12/08/2021*   Zoster (Shingles) Vaccine (1 of 2) 02/22/2022*   Tetanus Vaccine  03/27/2022*   Hepatitis C Screening: USPSTF Recommendation to screen - Ages 18-79 yo.  03/27/2022*   Pneumonia Vaccine  Completed   HPV Vaccine  Aged Out   Flu Shot  Discontinued   Colon Cancer Screening  Discontinued  *Topic was postponed. The date shown is not the original due date.    Advanced directives: Yes  Conditions/risks identified: None  Next appointment: Follow up in one year for your annual wellness visit.    Preventive Care 44 Years and Older, Male Preventive care refers to lifestyle choices and visits with your health care provider that can promote health and wellness. What does preventive care include? A yearly physical exam. This is also called an annual well check. Dental exams once or twice a year. Routine eye exams. Ask your health care provider how often you should have your eyes checked. Personal lifestyle choices, including: Daily care of your teeth and gums. Regular physical activity. Eating a healthy diet. Avoiding tobacco and drug use. Limiting alcohol use. Practicing safe sex. Taking low doses of aspirin every day. Taking vitamin and mineral supplements as recommended by your health care provider. What happens during an annual well check? The services and screenings done by your health care provider during your annual well check will depend on your age, overall  health, lifestyle risk factors, and family history of disease. Counseling  Your health care provider may ask you questions about your: Alcohol use. Tobacco use. Drug use. Emotional well-being. Home and relationship well-being. Sexual activity. Eating habits. History of falls. Memory and ability to understand (cognition). Work and work Statistician. Screening  You may have the following tests or measurements: Height, weight, and BMI. Blood pressure. Lipid and cholesterol levels. These may be checked every 5 years, or more frequently if you are over 79 years old. Skin check. Lung cancer screening. You may have this screening every year starting at age 5 if you have a 30-pack-year history of smoking and currently smoke or have quit within the past 15 years. Fecal occult blood test (FOBT) of the stool. You may have this test every year starting at age 71. Flexible sigmoidoscopy or colonoscopy. You may have a sigmoidoscopy every 5 years or a colonoscopy every 10 years starting at age 31. Prostate cancer screening. Recommendations will vary depending on your family history and other risks. Hepatitis C blood test. Hepatitis B blood test. Sexually transmitted disease (STD) testing. Diabetes screening. This is done by checking your blood sugar (glucose) after you have not eaten for a while (fasting). You may have this done every 1-3 years. Abdominal aortic aneurysm (AAA) screening. You may need this if you are a current or former smoker. Osteoporosis. You may be screened starting at age 73 if you are at high risk. Talk with your health  care provider about your test results, treatment options, and if necessary, the need for more tests. Vaccines  Your health care provider may recommend certain vaccines, such as: Influenza vaccine. This is recommended every year. Tetanus, diphtheria, and acellular pertussis (Tdap, Td) vaccine. You may need a Td booster every 10 years. Zoster vaccine. You may  need this after age 13. Pneumococcal 13-valent conjugate (PCV13) vaccine. One dose is recommended after age 52. Pneumococcal polysaccharide (PPSV23) vaccine. One dose is recommended after age 70. Talk to your health care provider about which screenings and vaccines you need and how often you need them. This information is not intended to replace advice given to you by your health care provider. Make sure you discuss any questions you have with your health care provider. Document Released: 05/26/2015 Document Revised: 01/17/2016 Document Reviewed: 02/28/2015 Elsevier Interactive Patient Education  2017 [place] Prevention in the Home Falls can cause injuries. They can happen to people of all ages. There are many things you can do to make your home safe and to help prevent falls. What can I do on the outside of my home? Regularly fix the edges of walkways and driveways and fix any cracks. Remove anything that might make you trip as you walk through a door, such as a raised step or threshold. Trim any bushes or trees on the path to your home. Use bright outdoor lighting. Clear any walking paths of anything that might make someone trip, such as rocks or tools. Regularly check to see if handrails are loose or broken. Make sure that both sides of any steps have handrails. Any raised decks and porches should have guardrails on the edges. Have any leaves, snow, or ice cleared regularly. Use sand or salt on walking paths during winter. Clean up any spills in your garage right away. This includes oil or grease spills. What can I do in the bathroom? Use night lights. Install grab bars by the toilet and in the tub and shower. Do not use towel bars as grab bars. Use non-skid mats or decals in the tub or shower. If you need to sit down in the shower, use a plastic, non-slip stool. Keep the floor dry. Clean up any water that spills on the floor as soon as it happens. Remove soap buildup in  the tub or shower regularly. Attach bath mats securely with double-sided non-slip rug tape. Do not have throw rugs and other things on the floor that can make you trip. What can I do in the bedroom? Use night lights. Make sure that you have a light by your bed that is easy to reach. Do not use any sheets or blankets that are too big for your bed. They should not hang down onto the floor. Have a firm chair that has side arms. You can use this for support while you get dressed. Do not have throw rugs and other things on the floor that can make you trip. What can I do in the kitchen? Clean up any spills right away. Avoid walking on wet floors. Keep items that you use a lot in easy-to-reach places. If you need to reach something above you, use a strong step stool that has a grab bar. Keep electrical cords out of the way. Do not use floor polish or wax that makes floors slippery. If you must use wax, use non-skid floor wax. Do not have throw rugs and other things on the floor that can make you trip. What can  I do with my stairs? Do not leave any items on the stairs. Make sure that there are handrails on both sides of the stairs and use them. Fix handrails that are broken or loose. Make sure that handrails are as long as the stairways. Check any carpeting to make sure that it is firmly attached to the stairs. Fix any carpet that is loose or worn. Avoid having throw rugs at the top or bottom of the stairs. If you do have throw rugs, attach them to the floor with carpet tape. Make sure that you have a light switch at the top of the stairs and the bottom of the stairs. If you do not have them, ask someone to add them for you. What else can I do to help prevent falls? Wear shoes that: Do not have high heels. Have rubber bottoms. Are comfortable and fit you well. Are closed at the toe. Do not wear sandals. If you use a stepladder: Make sure that it is fully opened. Do not climb a closed  stepladder. Make sure that both sides of the stepladder are locked into place. Ask someone to hold it for you, if possible. Clearly mark and make sure that you can see: Any grab bars or handrails. First and last steps. Where the edge of each step is. Use tools that help you move around (mobility aids) if they are needed. These include: Canes. Walkers. Scooters. Crutches. Turn on the lights when you go into a dark area. Replace any light bulbs as soon as they burn out. Set up your furniture so you have a clear path. Avoid moving your furniture around. If any of your floors are uneven, fix them. If there are any pets around you, be aware of where they are. Review your medicines with your doctor. Some medicines can make you feel dizzy. This can increase your chance of falling. Ask your doctor what other things that you can do to help prevent falls. This information is not intended to replace advice given to you by your health care provider. Make sure you discuss any questions you have with your health care provider. Document Released: 02/23/2009 Document Revised: 10/05/2015 Document Reviewed: 06/03/2014 Elsevier Interactive Patient Education  2017 [employer].

## 2021-11-22 NOTE — Progress Notes (Addendum)
Subjective:   Paul Burton is a 76 y.o. male who presents for Medicare Annual/Subsequent preventive examination.  Review of Systems    Virtual Visit via Telephone Note  I connected with  Paul Burton on 11/22/21 at  2:30 PM EDT by telephone and verified that I am speaking with the correct person using two identifiers.  Location: Patient: Home Provider: Office Persons participating in the virtual visit: patient/Nurse Health Advisor   I discussed the limitations, risks, security and privacy concerns of performing an evaluation and management service by telephone and the availability of in person appointments. The patient expressed understanding and agreed to proceed.  Interactive audio and video telecommunications were attempted between this nurse and patient, however failed, due to patient having technical difficulties OR patient did not have access to video capability.  We continued and completed visit with audio only.  Some vital signs may be absent or patient reported.   Criselda Peaches, LPN  Cardiac Risk Factors include: advanced age (>20mn, >>79women);hypertension;male gender     Objective:    Today's Vitals   11/22/21 1438  Weight: 150 lb (68 kg)  Height: '5\' 6"'$  (1.676 m)   Body mass index is 24.21 kg/m.     11/22/2021    2:44 PM 11/21/2020    3:20 PM 07/05/2019   11:33 AM  Advanced Directives  Does Patient Have a Medical Advance Directive? Yes Yes Yes  Type of AParamedicof [place] will [place] will [place] will  Does patient want to make changes to medical advance directive? No - Patient declined  No - Patient declined  Copy of [place] Chart? Yes - validated most recent copy scanned in chart (See row information) No - copy requested No - copy requested    Current Medications (verified) Outpatient Encounter Medications as of 11/22/2021   Medication Sig   Multiple Vitamin (MULTIVITAMIN) tablet Take 1 tablet by mouth daily.    ramipril (ALTACE) 5 MG capsule TAKE 1 CAPSULE DAILY   rosuvastatin (CRESTOR) 5 MG tablet TAKE 1 TABLET DAILY   No facility-administered encounter medications on file as of 11/22/2021.    Allergies (verified) Patient has no known allergies.   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   Past Surgical History:  Procedure Laterality Date   TONSILLECTOMY     Family History  Problem Relation Age of Onset   Stroke Mother    Cancer Father 849      rectal cancer   Alcohol abuse Neg Hx        family hx   Hypertension Neg Hx        family hx   Social History   Socioeconomic History   Marital status: Married    Spouse name: Not on file   Number of children: Not on file   Years of education: Not on file   Highest education level: Not on file  Occupational History   Not on file  Tobacco Use   Smoking status: Never   Smokeless tobacco: Never  Vaping Use   Vaping Use: Never used  Substance and Sexual Activity   Alcohol use: Not Currently   Drug use: No   Sexual activity: Not on file  Other Topics Concern   Not on file  Social History Narrative   Not on file   Social Determinants of Health   Financial Resource Strain: Low Risk  (11/22/2021)   Overall Financial Resource Strain (CARDIA)    Difficulty of Paying Living Expenses: Not hard at all  Food Insecurity: No Food Insecurity (11/22/2021)   Hunger Vital Sign    Worried About Running Out of Food in the Last Year: Never true    Ran Out of Food in the Last Year: Never true  Transportation Needs: No Transportation Needs (11/22/2021)   PRAPARE - Hydrologist (Medical): No    Lack of Transportation (Non-Medical): No  Physical Activity: Sufficiently Active (11/22/2021)    Exercise Vital Sign    Days of Exercise per Week: 6 days    Minutes of Exercise per Session: 50 min  Stress: No Stress Concern Present (11/22/2021)   [place]    Feeling of Stress : Not at all  Social Connections: [place] (11/22/2021)   Social Connection and Isolation Panel [NHANES]    Frequency of Communication with Friends and Family: Three times a week    Frequency of Social Gatherings with Friends and Family: Twice a week    Attends Religious Services: More than 4 times per year    Active Member of [employer] or Organizations: Yes    Attends Music therapist: More than 4 times per year    Marital Status: Married     Clinical Intake:  BMI - recorded: 24.29 Nutritional Status: BMI of 19-24  Normal Nutritional Risks: None Diabetes: No  How often do you need to have someone help you when you read instructions, pamphlets, or other written materials from your doctor or pharmacy?: 1 - Never  Diabetic?  No   Activities of Daily Living    11/22/2021    2:43 PM 11/21/2021    1:18 PM  In your present state of health, do you have any difficulty performing the following activities:  Hearing? 0 0  Vision? 0 0  Difficulty concentrating or making decisions? 0 0  Walking or climbing stairs? 0 0  Dressing or bathing? 0 0  Doing errands, shopping? 0 0  Preparing Food and eating ? N N  Using the Toilet? N N  In the past six months, have you accidently leaked urine? N N  Do you have problems with loss of bowel control? N N  Managing your Medications? N   Managing your Finances? N N  Housekeeping or managing your Housekeeping? N N    Patient Care Team: Eulas Post, MD as PCP - General  Indicate any recent Medical Services you may have received from other than [facility] providers in the past year (date may be approximate).     Assessment:   This is a routine wellness examination for  Paul Burton.  Hearing/Vision screen Hearing Screening - Comments:: No hearing difficulty Vision Screening - Comments:: Wears glasses. Followed by [facility]  Dietary issues and exercise activities discussed: Exercise limited by: None identified   Goals Addressed               This Visit's Progress     Patient Stated (pt-stated)        Maintain healthy active lifestyle.        Depression Screen    11/22/2021    2:41 PM 11/21/2020    3:21 PM 11/21/2020    3:18 PM 07/05/2019   11:38 AM 04/01/2018  8:58 AM 03/31/2017    2:10 PM 03/13/2016    9:24 AM  PHQ 2/9 Scores  PHQ - 2 Score 0 0 0 0 0 0 0    Fall Risk    11/22/2021    2:44 PM 11/21/2021    1:18 PM 11/21/2020    3:20 PM 07/05/2019   11:38 AM 04/07/2019   11:20 AM  [place] in the past year? 0 0 0 0 0  Comment     Emmi Telephone Survey: data to providers prior to load  Number falls in past yr: 0 0 0 0   Injury with Fall? 0 0 0 0   Risk for fall due to : No Fall Risks      Follow up   Falls evaluation completed Education provided;Falls prevention discussed     FALL RISK PREVENTION PERTAINING TO THE HOME:  Any stairs in or around the home? Yes  If so, are there any without handrails? No  Home free of loose throw rugs in walkways, pet beds, electrical cords, etc? Yes  Adequate lighting in your home to reduce risk of falls? Yes   ASSISTIVE DEVICES UTILIZED TO PREVENT FALLS:  Life alert? No  Use of a cane, walker or w/c? No  Grab bars in the bathroom? No  Shower chair or bench in shower? No  Elevated toilet seat or a handicapped toilet? Yes   TIMED UP AND GO:  Was the test performed? No . Audio Visit  Cognitive Function:        11/22/2021    2:45 PM  6CIT Screen  What Year? 0 points  What month? 0 points  What time? 0 points  Count back from 20 0 points  Months in reverse 0 points  Repeat phrase 0 points  Total Score 0 points    Immunizations Immunization History  Administered Date(s)  Administered   Fluad Quad(high Dose 65+) 03/13/2020, 03/09/2021   PFIZER(Purple Top)SARS-COV-2 Vaccination 06/26/2019, 07/19/2019, 03/11/2020   Pneumococcal Conjugate-13 07/28/2013   Pneumococcal Polysaccharide-23 03/13/2016   Td 05/13/2006      Flu Vaccine status: Up to date  Pneumococcal vaccine status: Completed during today's visit.  Covid-19 vaccine status: Completed vaccines  Qualifies for Shingles Vaccine? Yes   Zostavax completed No   Shingrix Completed?: No.    Education has been provided regarding the importance of this vaccine. Patient has been advised to call insurance company to determine out of pocket expense if they have not yet received this vaccine. Advised may also receive vaccine at local pharmacy or Health Dept. Verbalized acceptance and understanding.  Screening Tests Health Maintenance  Topic Date Due   COVID-19 Vaccine (4 - Pfizer series) 12/08/2021 (Originally 05/06/2020)   Zoster Vaccines- Shingrix (1 of 2) 02/22/2022 (Originally 09/26/1995)   TETANUS/TDAP  03/27/2022 (Originally 05/13/2016)   Hepatitis C Screening  03/27/2022 (Originally 09/26/1963)   Pneumonia Vaccine 47+ Years old  Completed   HPV VACCINES  Aged Out   INFLUENZA VACCINE  Discontinued   COLONOSCOPY (Pts 45-23yr Insurance coverage will need to be confirmed)  Discontinued    Health Maintenance  There are no preventive care reminders to display for this patient.     Lung Cancer Screening: (Low Dose CT Chest recommended if Age 76-80years, 30 pack-year currently smoking OR have quit w/in 15years.) does not qualify.     Additional Screening:  Hepatitis C Screening: does not qualify; Completed   Vision Screening: Recommended annual ophthalmology exams for early detection  of glaucoma and other disorders of the eye. Is the patient up to date with their annual eye exam?  Yes  Who is the provider or what is the name of the office in which the patient attends annual eye exams? [facility] If pt is not established with a provider, would they like to be referred to a provider to establish care? No .   Dental Screening: Recommended annual dental exams for proper oral hygiene  Community Resource Referral / Chronic Care Management:  CRR required this visit?  No   CCM required this visit?  No      Plan:     I have personally reviewed and noted the following in the patient's chart:   Medical and social history Use of alcohol, tobacco or illicit drugs  Current medications and supplements including opioid prescriptions. Patient is not currently taking opioid prescriptions. Functional ability and status Nutritional status Physical activity Advanced directives List of other physicians Hospitalizations, surgeries, and ER visits in previous 12 months Vitals Screenings to include cognitive, depression, and falls Referrals and appointments  In addition, I have reviewed and discussed with patient certain preventive protocols, quality metrics, and best practice recommendations. A written personalized care plan for preventive services as well as general preventive health recommendations were provided to patient.     Criselda Peaches, LPN   1/85/5015   Nurse Notes: None

## 2022-02-04 ENCOUNTER — Other Ambulatory Visit: Payer: Self-pay | Admitting: Family Medicine

## 2022-03-18 DIAGNOSIS — Z23 Encounter for immunization: Secondary | ICD-10-CM | POA: Diagnosis not present

## 2022-04-24 ENCOUNTER — Other Ambulatory Visit: Payer: Self-pay | Admitting: Family Medicine

## 2022-05-19 IMAGING — DX DG CHEST 2V
2 series · 2 of 2 positions shown · non-contrast
Comparison: None.

CLINICAL DATA: Excessive weight loss

EXAM:
CHEST - 2 VIEW

[chest pa]
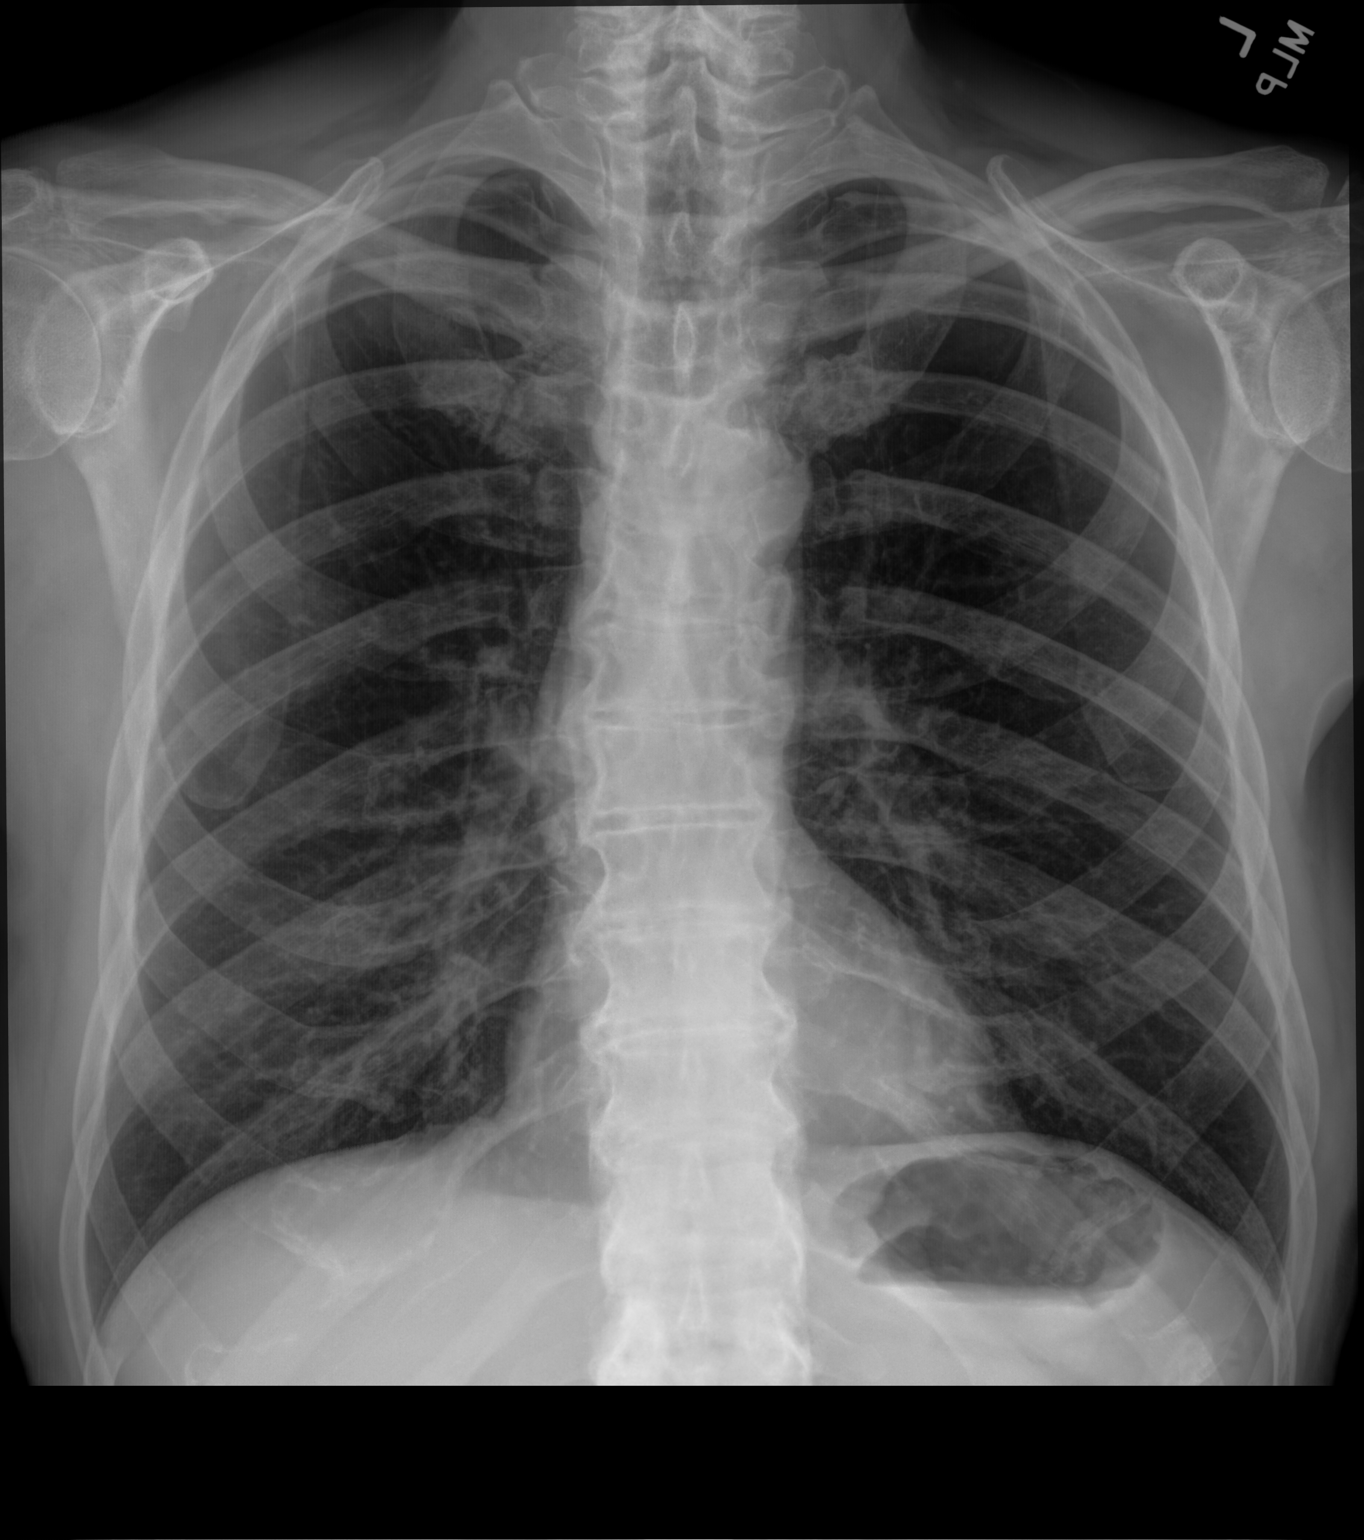

[chest lat]
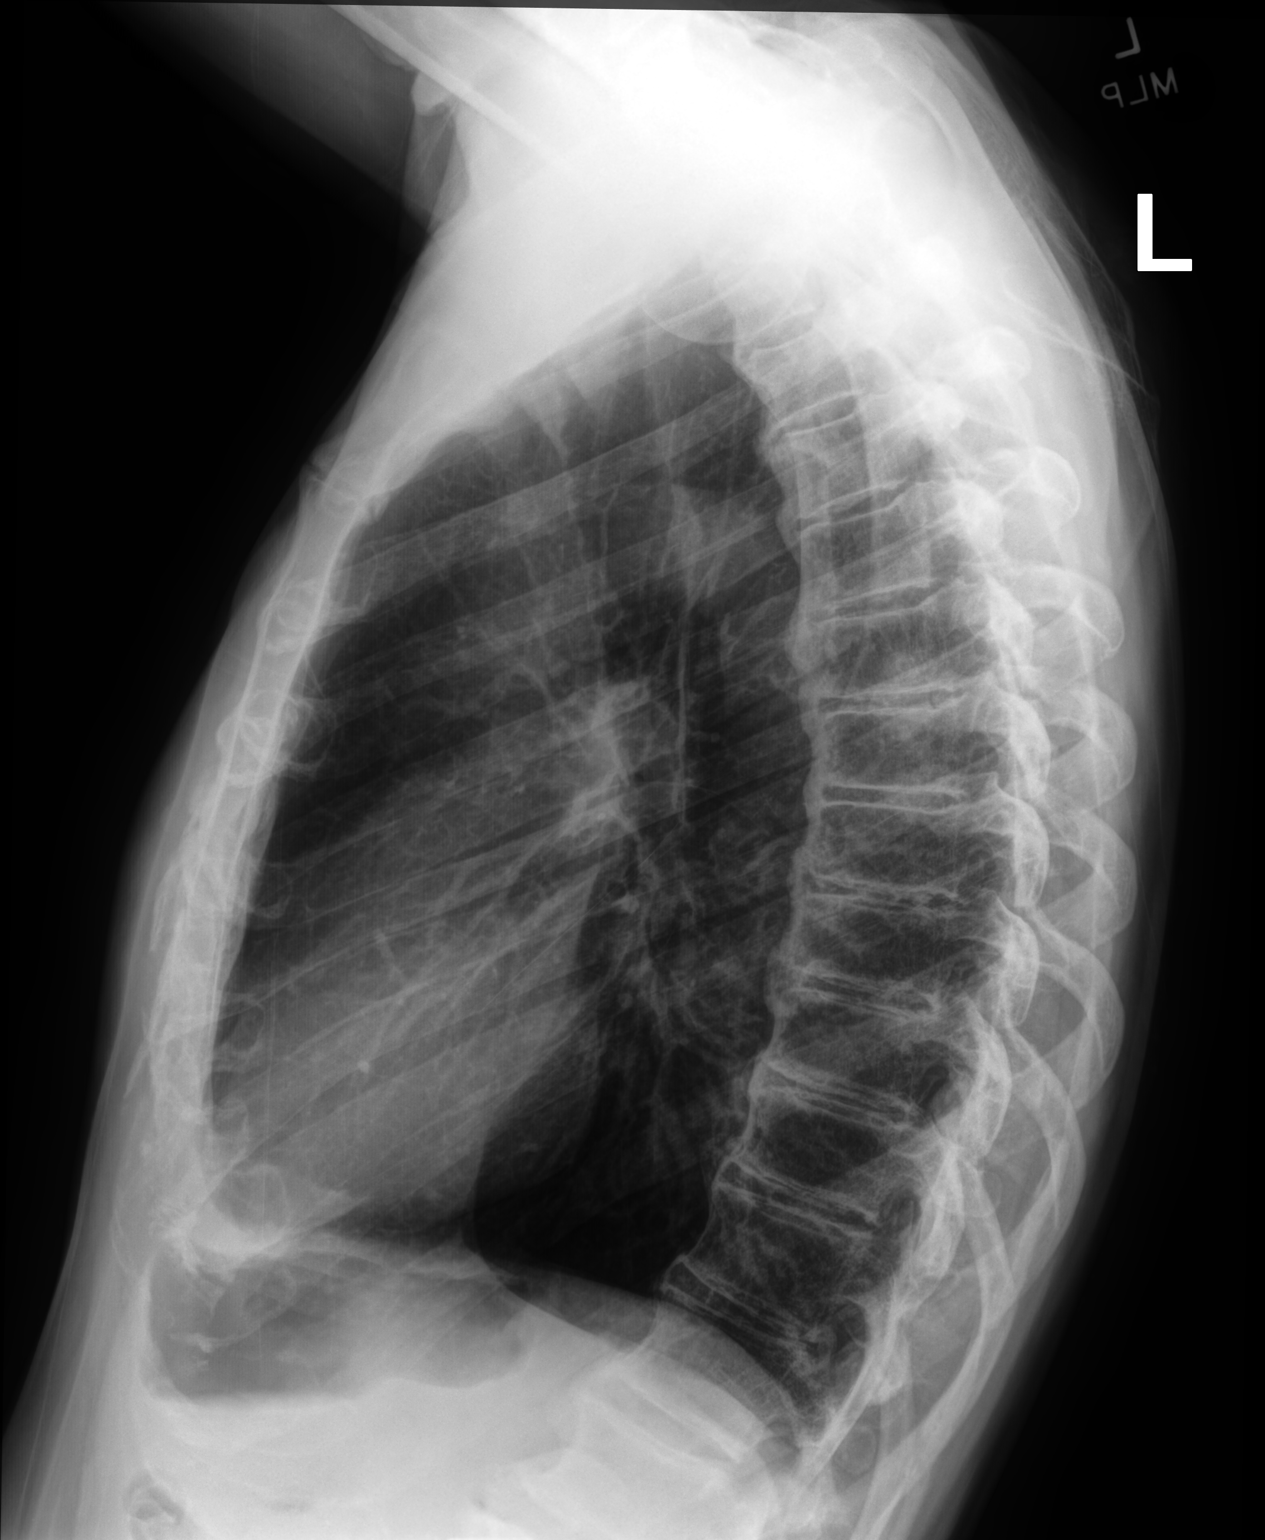

[2 of 2 positions shown; findings below may reference images not displayed]

FINDINGS: The heart size and mediastinal contours are within normal limits.
Both lungs are clear. Diffuse osteophytosis of the thoracic spine
IMPRESSION: No active cardiopulmonary disease.

## 2022-05-27 ENCOUNTER — Other Ambulatory Visit: Payer: Self-pay | Admitting: Family Medicine

## 2022-06-26 ENCOUNTER — Encounter: Payer: Self-pay | Admitting: Family Medicine

## 2022-06-26 ENCOUNTER — Ambulatory Visit (INDEPENDENT_AMBULATORY_CARE_PROVIDER_SITE_OTHER): Payer: Medicare Other | Admitting: Family Medicine

## 2022-06-26 VITALS — BP 142/60 | HR 67 | Ht 67.72 in | Wt 154.2 lb

## 2022-06-26 DIAGNOSIS — E785 Hyperlipidemia, unspecified: Secondary | ICD-10-CM

## 2022-06-26 DIAGNOSIS — I10 Essential (primary) hypertension: Secondary | ICD-10-CM | POA: Diagnosis not present

## 2022-06-26 LAB — LIPID PANEL
Cholesterol: 168 mg/dL (ref 0–200)
HDL: 66.4 mg/dL (ref >39.00)
LDL Cholesterol: 83 mg/dL (ref 0–99)
NonHDL: 101.43
Total CHOL/HDL Ratio: 3
Triglycerides: 90 mg/dL (ref 0.0–149.0)
VLDL: 18 mg/dL (ref 0.0–40.0)

## 2022-06-26 LAB — COMPREHENSIVE METABOLIC PANEL
ALT: 13 U/L (ref 0–53)
AST: 19 U/L (ref 0–37)
Albumin: 4.2 g/dL (ref 3.5–5.2)
Alkaline Phosphatase: 34 U/L — ABNORMAL LOW (ref 39–117)
BUN: 15 mg/dL (ref 6–23)
CO2: 30 mEq/L (ref 19–32)
Calcium: 9.4 mg/dL (ref 8.4–10.5)
Chloride: 98 mEq/L (ref 96–112)
Creatinine, Ser: 0.93 mg/dL (ref 0.40–1.50)
GFR: 79.63 mL/min (ref >60.00)
Glucose, Bld: 88 mg/dL (ref 70–99)
Potassium: 3.8 mEq/L (ref 3.5–5.1)
Sodium: 135 mEq/L (ref 135–145)
Total Bilirubin: 0.7 mg/dL (ref 0.2–1.2)
Total Protein: 7.1 g/dL (ref 6.0–8.3)

## 2022-06-26 MED ORDER — RAMIPRIL 5 MG PO CAPS: 5.0000 mg | ORAL_CAPSULE | Freq: Every day | ORAL | 3 refills | Status: DC

## 2022-06-26 MED ORDER — ROSUVASTATIN CALCIUM 5 MG PO TABS: 5.0000 mg | ORAL_TABLET | Freq: Every day | ORAL | 3 refills | Status: DC

## 2022-06-26 NOTE — Progress Notes (Signed)
Established Patient Office Visit  Subjective   Patient ID: Paul Burton, male    DOB: Jun 02, 1945  Age: 77 y.o. MRN: [id number]  Chief Complaint  Patient presents with   Annual Exam    HPI   Mr. Gul is here for medical follow-up.  He has history of hypertension treated with Altace 5 mg once daily.  Brings in log of blood pressures.  Home blood pressures have consistently been [id number] to [id number] systolic and 0000000 diastolic.  He denies any recent headaches or dizziness.  He is walking regularly usually around an hour per day at least 5 days/week.  No recent falls.  Hyperlipidemia treated with rosuvastatin 5 mg daily.  Overdue for labs.  No myalgias.  No recent chest pains.  No family history of premature CAD.  He has not had shingles vaccine and tetanus is due.  He is aware that Medicare may not cover tetanus in the absence of injury.  He is generally doing well with no new symptoms.  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 He has never used smokeless tobacco. He reports that he does not currently use alcohol. He reports that he does not use drugs. family history includes Cancer (age of onset: 35) in his father; Stroke in his mother. No Known Allergies  Review of Systems  Constitutional:  Negative for malaise/fatigue.  Eyes:  Negative for blurred vision.  Respiratory:  Negative for shortness of breath.   Cardiovascular:  Negative for chest pain.  Gastrointestinal:  Negative for abdominal pain.  Neurological:  Negative for dizziness, weakness and headaches.      Objective:     BP (!) 142/60 (BP Location: Left Arm, Cuff Size: Normal)   Pulse 67   Ht 5' 7.72" (1.72 m)   Wt 154 lb 3.2 oz (69.9 kg)   SpO2 98%   BMI 23.64 kg/m  BP  Readings from Last 3 Encounters:  06/26/22 (!) 142/60  03/27/21 140/60  03/13/20 114/68   Wt Readings from Last 3 Encounters:  06/26/22 154 lb 3.2 oz (69.9 kg)  11/22/21 150 lb (68 kg)  03/27/21 150 lb 6.4 oz (68.2 kg)      Physical Exam Vitals reviewed.  Constitutional:      Appearance: He is well-developed.  HENT:     Right Ear: External ear normal.     Left Ear: External ear normal.  Eyes:     Pupils: Pupils are equal, round, and reactive to light.  Neck:     Thyroid: No thyromegaly.  Cardiovascular:     Rate and Rhythm: Normal rate and regular rhythm.  Pulmonary:     Effort: Pulmonary effort is normal. No respiratory distress.     Breath sounds: Normal breath sounds. No wheezing or rales.  Musculoskeletal:     Cervical back: Neck supple.  Neurological:     Mental Status: He is alert and oriented to person, place, and time.      No results found for any visits on 06/26/22.    The 10-year ASCVD risk score (Arnett DK, et al., 2019) is: 31%    Assessment & Plan:   Problem List Items Addressed This Visit       Unprioritized  Hyperlipidemia   Relevant Medications   ramipril (ALTACE) 5 MG capsule   rosuvastatin (CRESTOR) 5 MG tablet   Other Relevant Orders   Lipid panel   Essential hypertension - Primary   Relevant Medications   ramipril (ALTACE) 5 MG capsule   rosuvastatin (CRESTOR) 5 MG tablet   Other Relevant Orders   CMP  -Hypertension with slightly elevated readings here today.  This did come down substantially after some rest.  Suspect some element of whitecoat syndrome.  Blood pressures consistently well-controlled at home.  Refill Altace 5 mg once daily.  Continue regular walking and exercise activities.  Continue close monitoring  -Refill rosuvastatin 5 mg daily.  Recheck labs as above with lipid and CMP -Continue low saturated fat diet -Routine follow-up in 1 year and sooner as needed  No follow-ups on file.    Carolann Littler, MD

## 2022-11-27 ENCOUNTER — Ambulatory Visit: Payer: Medicare Other

## 2022-11-27 VITALS — Ht 67.0 in | Wt 150.0 lb

## 2022-11-27 DIAGNOSIS — Z0000 Encounter for general adult medical examination without abnormal findings: Secondary | ICD-10-CM | POA: Diagnosis not present

## 2022-11-27 NOTE — Progress Notes (Signed)
Subjective:   Paul Burton is a 77 y.o. male who presents for Medicare Annual/Subsequent preventive examination.  Visit Complete: Virtual  I connected with  Paul Burton on 11/27/22 by a audio enabled telemedicine application and verified that I am speaking with the correct person using two identifiers.  Patient Location: Home  Provider Location: Home Office  I discussed the limitations of evaluation and management by telemedicine. The patient expressed understanding and agreed to proceed.  Patient Medicare AWV questionnaire was completed by the patient on 11/20/22; I have confirmed that all information answered by patient is correct and no changes since this date.  Review of Systems      Cardiac Risk Factors include: advanced age (>65men, >66 women);male gender;hypertension     Objective:    Today's Vitals   11/27/22 1438  Weight: 150 lb (68 kg)  Height: 5\' 7"  (1.702 m)   Body mass index is 23.49 kg/m.     11/27/2022    2:44 PM 11/22/2021    2:44 PM 11/21/2020    3:20 PM 07/05/2019   11:33 AM  Advanced Directives  Does Patient Have a Medical Advance Directive? Yes Yes Yes Yes  Type of Estate agent of [place];Living will Healthcare Power of [place];Living will Healthcare Power of [place];Living will Healthcare Power of [place];Living will  Does patient want to make changes to medical advance directive? No - Patient declined No - Patient declined  No - Patient declined  Copy of Healthcare Power of Attorney in Chart? Yes - validated most recent copy scanned in chart (See row information) Yes - validated most recent copy scanned in chart (See row information) No - copy requested No - copy requested    Current Medications (verified) Outpatient Encounter Medications as of 11/27/2022  Medication Sig   Multiple Vitamin (MULTIVITAMIN) tablet Take 1 tablet by mouth daily.    ramipril (ALTACE) 5 MG capsule Take 1 capsule (5 mg total) by mouth  daily.   rosuvastatin (CRESTOR) 5 MG tablet Take 1 tablet (5 mg total) by mouth daily.   No facility-administered encounter medications on file as of 11/27/2022.    Allergies (verified) Patient has no known allergies.   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   Past Surgical History:  Procedure Laterality Date   TONSILLECTOMY     Family History  Problem Relation Age of Onset   Stroke Mother    Cancer Father 24       rectal cancer   Alcohol abuse Neg Hx        family hx   Hypertension Neg Hx        family hx   Social History   Socioeconomic History   Marital status: Married    Spouse name: Not on file   Number of children: Not on file   Years of education: Not on file   Highest education level: Not on file  Occupational History   Not on file  Tobacco Use   Smoking status: Never   Smokeless tobacco: Never  Vaping Use   Vaping status: Never Used  Substance and Sexual Activity   Alcohol use: Not Currently   Drug use: No   Sexual activity: Not on file  Other Topics Concern   Not on file  Social History Narrative   Not on file  Social Determinants of Health   Financial Resource Strain: Low Risk  (11/27/2022)   Overall Financial Resource Strain (CARDIA)    Difficulty of Paying Living Expenses: Not hard at all  Food Insecurity: No Food Insecurity (11/27/2022)   Hunger Vital Sign    Worried About Running Out of Food in the Last Year: Never true    Ran Out of Food in the Last Year: Never true  Transportation Needs: No Transportation Needs (11/27/2022)   PRAPARE - Administrator, Civil Service (Medical): No    Lack of Transportation (Non-Medical): No  Physical Activity: Sufficiently Active (11/27/2022)   Exercise Vital Sign    Days of Exercise per Week: 6 days    Minutes of Exercise per Session: 40 min  Stress:  No Stress Concern Present (11/27/2022)   [employer] of Occupational Health - Occupational Stress Questionnaire    Feeling of Stress : Not at all  Social Connections: Socially Integrated (11/27/2022)   Social Connection and Isolation Panel [NHANES]    Frequency of Communication with Friends and Family: More than three times a week    Frequency of Social Gatherings with Friends and Family: More than three times a week    Attends Religious Services: More than 4 times per year    Active Member of [employer] or Organizations: Yes    Attends Engineer, structural: More than 4 times per year    Marital Status: Married    Tobacco Counseling Counseling given: Not Answered   Clinical Intake:  Pre-visit preparation completed: Yes  BMI - recorded: 23.49 Nutritional Risks: None Diabetes: No  How often do you need to have someone help you when you read instructions, pamphlets, or other written materials from your doctor or pharmacy?: 1 - Never  Interpreter Needed?: No  Information entered by :: Theresa Mulligan LPN   Activities of Daily Living    11/27/2022    2:44 PM 11/20/2022   11:18 AM  In your present state of health, do you have any difficulty performing the following activities:  Hearing? 0 0  Vision? 0 0  Difficulty concentrating or making decisions? 0 0  Walking or climbing stairs? 0 0  Dressing or bathing? 0 0  Doing errands, shopping? 0 0  Preparing Food and eating ? N N  Using the Toilet? N N  In the past six months, have you accidently leaked urine? N N  Do you have problems with loss of bowel control? N N  Managing your Medications? N N  Managing your Finances? N N  Housekeeping or managing your Housekeeping? N N    Patient Care Team: Kristian Covey, MD as PCP - General  Indicate any recent Medical Services you may have received from other than [facility] providers in the past year (date may be approximate).     Assessment:   This is a routine wellness  examination for [name].  Hearing/Vision screen Hearing Screening - Comments:: Denies hearing difficulties   Vision Screening - Comments:: Wears rx glasses - up to date with routine eye exams with  [facility]  Dietary issues and exercise activities discussed:     Goals Addressed               This Visit's Progress     Patient Stated (pt-stated)        Maintain healthy active lifestyle.        Depression Screen    11/27/2022    2:43 PM 06/26/2022  7:59 AM 11/22/2021    2:41 PM 11/21/2020    3:21 PM 11/21/2020    3:18 PM 07/05/2019   11:38 AM 04/01/2018    8:58 AM  PHQ 2/9 Scores  PHQ - 2 Score 0 0 0 0 0 0 0    Fall Risk    11/27/2022    2:44 PM 11/20/2022   11:18 AM 06/26/2022    7:59 AM 11/22/2021    2:44 PM 11/21/2021    1:18 PM  Fall Risk   Falls in the past year? 0 0 0 0 0  Number falls in past yr: 0  0 0 0  Injury with Fall? 0  0 0 0  Risk for fall due to : No Fall Risks  No Fall Risks No Fall Risks   Follow up Falls prevention discussed  Falls evaluation completed      MEDICARE RISK AT HOME:  Medicare Risk at Home - 11/27/22 1448     Any stairs in or around the home? Yes    If so, are there any without handrails? No    Home free of loose throw rugs in walkways, pet beds, electrical cords, etc? Yes    Adequate lighting in your home to reduce risk of falls? Yes    Life alert? No    Use of a cane, walker or w/c? No    Grab bars in the bathroom? Yes    Shower chair or bench in shower? No    Elevated toilet seat or a handicapped toilet? No             TIMED UP AND GO:  Was the test performed?  No    Cognitive Function:        11/27/2022    2:44 PM 11/22/2021    2:45 PM  6CIT Screen  What Year? 0 points 0 points  What month? 0 points 0 points  What time? 0 points 0 points  Count back from 20 0 points 0 points  Months in reverse 0 points 0 points  Repeat phrase 0 points 0 points  Total Score 0 points 0 points     Immunizations Immunization History  Administered Date(s) Administered   Fluad Quad(high Dose 65+) 03/13/2020, 03/09/2021   PFIZER(Purple Top)SARS-COV-2 Vaccination 06/26/2019, 07/19/2019, 03/11/2020   Pneumococcal Conjugate-13 07/28/2013   Pneumococcal Polysaccharide-23 03/13/2016   Td 05/13/2006    TDAP status: Due, Education has been provided regarding the importance of this vaccine. Advised may receive this vaccine at local pharmacy or Health Dept. Aware to provide a copy of the vaccination record if obtained from local pharmacy or Health Dept. Verbalized acceptance and understanding.    Pneumococcal vaccine status: Up to date  Covid-19 vaccine status: Completed vaccines  Qualifies for Shingles Vaccine? Yes   Zostavax completed No   Shingrix Completed?: No.    Education has been provided regarding the importance of this vaccine. Patient has been advised to call insurance company to determine out of pocket expense if they have not yet received this vaccine. Advised may also receive vaccine at local pharmacy or Health Dept. Verbalized acceptance and understanding.  Screening Tests Health Maintenance  Topic Date Due   DTaP/Tdap/Td (2 - Tdap) 05/13/2016   COVID-19 Vaccine (4 - 2023-24 season) 12/13/2022 (Originally 01/11/2022)   Zoster Vaccines- Shingrix (1 of 2) 02/27/2023 (Originally 09/26/1995)   Hepatitis C Screening  11/27/2023 (Originally 09/26/1963)   Medicare Annual Wellness (AWV)  11/27/2023   Pneumonia Vaccine 67+ Years old  Completed   HPV VACCINES  Aged Out   INFLUENZA VACCINE  Discontinued   Colonoscopy  Discontinued    Health Maintenance  Health Maintenance Due  Topic Date Due   DTaP/Tdap/Td (2 - Tdap) 05/13/2016    Colorectal cancer screening: No longer required.   Lung Cancer Screening: (Low Dose CT Chest recommended if Age 21-80 years, 20 pack-year currently smoking OR have quit w/in 15years.) does not qualify.     Additional Screening:  Hepatitis  C Screening: does qualify; Deferred  Vision Screening: Recommended annual ophthalmology exams for early detection of glaucoma and other disorders of the eye. Is the patient up to date with their annual eye exam?  Yes  Who is the provider or what is the name of the office in which the patient attends annual eye exams? [facility] If pt is not established with a provider, would they like to be referred to a provider to establish care? No .   Dental Screening: Recommended annual dental exams for proper oral hygiene  Community Resource Referral / Chronic Care Management:  CRR required this visit?  No   CCM required this visit?  No     Plan:     I have personally reviewed and noted the following in the patient's chart:   Medical and social history Use of alcohol, tobacco or illicit drugs  Current medications and supplements including opioid prescriptions. Patient is not currently taking opioid prescriptions. Functional ability and status Nutritional status Physical activity Advanced directives List of other physicians Hospitalizations, surgeries, and ER visits in previous 12 months Vitals Screenings to include cognitive, depression, and falls Referrals and appointments  In addition, I have reviewed and discussed with patient certain preventive protocols, quality metrics, and best practice recommendations. A written personalized care plan for preventive services as well as general preventive health recommendations were provided to patient.     Tillie Rung, LPN   [id number]   After Visit Summary: (Mail) Due to this being a telephonic visit, the after visit summary with patients personalized plan was offered to patient via mail   Nurse Notes: Patient due Hep-C Screening

## 2022-11-27 NOTE — Patient Instructions (Addendum)
Paul Burton , Thank you for taking time to come for your Medicare Wellness Visit. I appreciate your ongoing commitment to your health goals. Please review the following plan we discussed and let me know if I can assist you in the future.   These are the goals we discussed:  Goals       Patient Stated (pt-stated)      Maintain healthy active lifestyle.         This is a list of the screening recommended for you and due dates:  Health Maintenance  Topic Date Due   DTaP/Tdap/Td vaccine (2 - Tdap) 05/13/2016   COVID-19 Vaccine (4 - 2023-24 season) 12/13/2022*   Zoster (Shingles) Vaccine (1 of 2) 02/27/2023*   Hepatitis C Screening  11/27/2023*   Medicare Annual Wellness Visit  11/27/2023   Pneumonia Vaccine  Completed   HPV Vaccine  Aged Out   Flu Shot  Discontinued   Colon Cancer Screening  Discontinued  *Topic was postponed. The date shown is not the original due date.    Advanced directives: In Chart  Conditions/risks identified: None  Next appointment: Follow up in one year for your annual wellness visit.   Preventive Care 32 Years and Older, Male  Preventive care refers to lifestyle choices and visits with your health care provider that can promote health and wellness. What does preventive care include? A yearly physical exam. This is also called an annual well check. Dental exams once or twice a year. Routine eye exams. Ask your health care provider how often you should have your eyes checked. Personal lifestyle choices, including: Daily care of your teeth and gums. Regular physical activity. Eating a healthy diet. Avoiding tobacco and drug use. Limiting alcohol use. Practicing safe sex. Taking low doses of aspirin every day. Taking vitamin and mineral supplements as recommended by your health care provider. What happens during an annual well check? The services and screenings done by your health care provider during your annual well check will depend on your  age, overall health, lifestyle risk factors, and family history of disease. Counseling  Your health care provider may ask you questions about your: Alcohol use. Tobacco use. Drug use. Emotional well-being. Home and relationship well-being. Sexual activity. Eating habits. History of falls. Memory and ability to understand (cognition). Work and work Astronomer. Screening  You may have the following tests or measurements: Height, weight, and BMI. Blood pressure. Lipid and cholesterol levels. These may be checked every 5 years, or more frequently if you are over 3 years old. Skin check. Lung cancer screening. You may have this screening every year starting at age 77 if you have a 30-pack-year history of smoking and currently smoke or have quit within the past 15 years. Fecal occult blood test (FOBT) of the stool. You may have this test every year starting at age 77. Flexible sigmoidoscopy or colonoscopy. You may have a sigmoidoscopy every 5 years or a colonoscopy every 10 years starting at age 77. Prostate cancer screening. Recommendations will vary depending on your family history and other risks. Hepatitis C blood test. Hepatitis B blood test. Sexually transmitted disease (STD) testing. Diabetes screening. This is done by checking your blood sugar (glucose) after you have not eaten for a while (fasting). You may have this done every 1-3 years. Abdominal aortic aneurysm (AAA) screening. You may need this if you are a current or former smoker. Osteoporosis. You may be screened starting at age 77 if you are at high risk. Talk  with your health care provider about your test results, treatment options, and if necessary, the need for more tests. Vaccines  Your health care provider may recommend certain vaccines, such as: Influenza vaccine. This is recommended every year. Tetanus, diphtheria, and acellular pertussis (Tdap, Td) vaccine. You may need a Td booster every 10 years. Zoster  vaccine. You may need this after age 77. Pneumococcal 13-valent conjugate (PCV13) vaccine. One dose is recommended after age 77. Pneumococcal polysaccharide (PPSV23) vaccine. One dose is recommended after age 77. Talk to your health care provider about which screenings and vaccines you need and how often you need them. This information is not intended to replace advice given to you by your health care provider. Make sure you discuss any questions you have with your health care provider. Document Released: 05/26/2015 Document Revised: 01/17/2016 Document Reviewed: 02/28/2015 Elsevier Interactive Patient Education  2017 [employer].  Fall Prevention in the Home Falls can cause injuries. They can happen to people of all ages. There are many things you can do to make your home safe and to help prevent falls. What can I do on the outside of my home? Regularly fix the edges of walkways and driveways and fix any cracks. Remove anything that might make you trip as you walk through a door, such as a raised step or threshold. Trim any bushes or trees on the path to your home. Use bright outdoor lighting. Clear any walking paths of anything that might make someone trip, such as rocks or tools. Regularly check to see if handrails are loose or broken. Make sure that both sides of any steps have handrails. Any raised decks and porches should have guardrails on the edges. Have any leaves, snow, or ice cleared regularly. Use sand or salt on walking paths during winter. Clean up any spills in your garage right away. This includes oil or grease spills. What can I do in the bathroom? Use night lights. Install grab bars by the toilet and in the tub and shower. Do not use towel bars as grab bars. Use non-skid mats or decals in the tub or shower. If you need to sit down in the shower, use a plastic, non-slip stool. Keep the floor dry. Clean up any water that spills on the floor as soon as it happens. Remove  soap buildup in the tub or shower regularly. Attach bath mats securely with double-sided non-slip rug tape. Do not have throw rugs and other things on the floor that can make you trip. What can I do in the bedroom? Use night lights. Make sure that you have a light by your bed that is easy to reach. Do not use any sheets or blankets that are too big for your bed. They should not hang down onto the floor. Have a firm chair that has side arms. You can use this for support while you get dressed. Do not have throw rugs and other things on the floor that can make you trip. What can I do in the kitchen? Clean up any spills right away. Avoid walking on wet floors. Keep items that you use a lot in easy-to-reach places. If you need to reach something above you, use a strong step stool that has a grab bar. Keep electrical cords out of the way. Do not use floor polish or wax that makes floors slippery. If you must use wax, use non-skid floor wax. Do not have throw rugs and other things on the floor that can make you  trip. What can I do with my stairs? Do not leave any items on the stairs. Make sure that there are handrails on both sides of the stairs and use them. Fix handrails that are broken or loose. Make sure that handrails are as long as the stairways. Check any carpeting to make sure that it is firmly attached to the stairs. Fix any carpet that is loose or worn. Avoid having throw rugs at the top or bottom of the stairs. If you do have throw rugs, attach them to the floor with carpet tape. Make sure that you have a light switch at the top of the stairs and the bottom of the stairs. If you do not have them, ask someone to add them for you. What else can I do to help prevent falls? Wear shoes that: Do not have high heels. Have rubber bottoms. Are comfortable and fit you well. Are closed at the toe. Do not wear sandals. If you use a stepladder: Make sure that it is fully opened. Do not climb a  closed stepladder. Make sure that both sides of the stepladder are locked into place. Ask someone to hold it for you, if possible. Clearly mark and make sure that you can see: Any grab bars or handrails. First and last steps. Where the edge of each step is. Use tools that help you move around (mobility aids) if they are needed. These include: Canes. Walkers. Scooters. Crutches. Turn on the lights when you go into a dark area. Replace any light bulbs as soon as they burn out. Set up your furniture so you have a clear path. Avoid moving your furniture around. If any of your floors are uneven, fix them. If there are any pets around you, be aware of where they are. Review your medicines with your doctor. Some medicines can make you feel dizzy. This can increase your chance of falling. Ask your doctor what other things that you can do to help prevent falls. This information is not intended to replace advice given to you by your health care provider. Make sure you discuss any questions you have with your health care provider. Document Released: 02/23/2009 Document Revised: 10/05/2015 Document Reviewed: 06/03/2014 Elsevier Interactive Patient Education  2017 [employer].

## 2023-04-02 DIAGNOSIS — Z23 Encounter for immunization: Secondary | ICD-10-CM | POA: Diagnosis not present

## 2023-05-05 ENCOUNTER — Other Ambulatory Visit: Payer: Self-pay | Admitting: Family Medicine

## 2023-07-22 ENCOUNTER — Other Ambulatory Visit: Payer: Self-pay | Admitting: Family Medicine

## 2023-08-22 ENCOUNTER — Other Ambulatory Visit: Payer: Self-pay | Admitting: Family Medicine

## 2023-12-01 ENCOUNTER — Ambulatory Visit (INDEPENDENT_AMBULATORY_CARE_PROVIDER_SITE_OTHER): Payer: Medicare Other

## 2023-12-01 VITALS — Ht 68.0 in | Wt 145.0 lb

## 2023-12-01 DIAGNOSIS — Z0000 Encounter for general adult medical examination without abnormal findings: Secondary | ICD-10-CM

## 2023-12-01 NOTE — Progress Notes (Signed)
 Subjective:   Paul Burton is a 78 y.o. who presents for a Medicare Wellness preventive visit.  As a reminder, Annual Wellness Visits don't include a physical exam, and some assessments may be limited, especially if this visit is performed virtually. We may recommend an in-person follow-up visit with your provider if needed.  Visit Complete: Virtual I connected with  Paul Burton on 12/01/23 by a audio enabled telemedicine application and verified that I am speaking with the correct person using two identifiers.  Patient Location: Home  Provider Location: Home Office  I discussed the limitations of evaluation and management by telemedicine. The patient expressed understanding and agreed to proceed.  Vital Signs: Because this visit was a virtual/telehealth visit, some criteria may be missing or patient reported. Any vitals not documented were not able to be obtained and vitals that have been documented are patient reported.    Persons Participating in Visit: Patient.  AWV Questionnaire: Yes: Patient Medicare AWV questionnaire was completed by the patient on 11/27/23; I have confirmed that all information answered by patient is correct and no changes since this date.        Objective:    Today's Vitals   12/01/23 1439  Weight: 145 lb (65.8 kg)  Height: 5' 8 (1.727 m)   Body mass index is 22.05 kg/m.     12/01/2023    2:46 PM 11/27/2022    2:44 PM 11/22/2021    2:44 PM 11/21/2020    3:20 PM 07/05/2019   11:33 AM  Advanced Directives  Does Patient Have a Medical Advance Directive? Yes Yes Yes Yes Yes  Type of Estate agent of [place];Living will Healthcare Power of [place];Living will Healthcare Power of [place];Living will Healthcare Power of [place];Living will Healthcare Power of [place];Living will  Does patient want to make changes to medical advance directive? No - Patient declined No - Patient declined No - Patient declined  No -  Patient declined  Copy of Healthcare Power of Attorney in Chart? Yes - validated most recent copy scanned in chart (See row information) Yes - validated most recent copy scanned in chart (See row information) Yes - validated most recent copy scanned in chart (See row information) No - copy requested No - copy requested    Current Medications (verified) Outpatient Encounter Medications as of 12/01/2023  Medication Sig   Multiple Vitamin (MULTIVITAMIN) tablet Take 1 tablet by mouth daily.    ramipril  (ALTACE ) 5 MG capsule TAKE 1 CAPSULE DAILY   rosuvastatin  (CRESTOR ) 5 MG tablet TAKE 1 TABLET DAILY   No facility-administered encounter medications on file as of 12/01/2023.    Allergies (verified) Patient has no known allergies.   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   Past Surgical History:  Procedure Laterality Date   TONSILLECTOMY     Family History  Problem Relation Age of Onset   Stroke Mother    Cancer Father 36       rectal cancer   Alcohol abuse Neg Hx        family hx   Hypertension Neg Hx        family hx   Social History   Socioeconomic History   Marital status: Married    Spouse name: Not on file   Number of children: Not on file   Years  of education: Not on file   Highest education level: Master's degree (e.g., MA, MS, MEng, MEd, MSW, MBA)  Occupational History   Not on file  Tobacco Use   Smoking status: Never   Smokeless tobacco: Never  Vaping Use   Vaping status: Never Used  Substance and Sexual Activity   Alcohol use: Not Currently   Drug use: No   Sexual activity: Not on file  Other Topics Concern   Not on file  Social History Narrative   Not on file   Social Drivers of Health   Financial Resource Strain: Low Risk  (12/01/2023)   Overall Financial Resource Strain (CARDIA)    Difficulty of Paying  Living Expenses: Not hard at all  Food Insecurity: No Food Insecurity (12/01/2023)   Hunger Vital Sign    Worried About Running Out of Food in the Last Year: Never true    Ran Out of Food in the Last Year: Never true  Transportation Needs: No Transportation Needs (12/01/2023)   PRAPARE - Administrator, Civil Service (Medical): No    Lack of Transportation (Non-Medical): No  Physical Activity: Sufficiently Active (12/01/2023)   Exercise Vital Sign    Days of Exercise per Week: 6 days    Minutes of Exercise per Session: 40 min  Stress: No Stress Concern Present (12/01/2023)   [employer] of Occupational Health - Occupational Stress Questionnaire    Feeling of Stress: Not at all  Social Connections: Socially Integrated (12/01/2023)   Social Connection and Isolation Panel    Frequency of Communication with Friends and Family: Twice a week    Frequency of Social Gatherings with Friends and Family: Once a week    Attends Religious Services: More than 4 times per year    Active Member of [employer] or Organizations: Yes    Attends Engineer, structural: More than 4 times per year    Marital Status: Married    Tobacco Counseling Counseling given: Not Answered    Clinical Intake:  Pre-visit preparation completed: Yes  Pain : No/denies pain     BMI - recorded: 22.05 Nutritional Status: BMI of 19-24  Normal Nutritional Risks: None Diabetes: No  No results found for: HGBA1C   How often do you need to have someone help you when you read instructions, pamphlets, or other written materials from your doctor or pharmacy?: 1 - Never  Interpreter Needed?: No  Information entered by :: Rojelio Blush LPN   Activities of Daily Living     12/01/2023    2:43 PM 11/27/2023   12:11 PM  In your present state of health, do you have any difficulty performing the following activities:  Hearing? 0 0  Vision? 0 0  Difficulty concentrating or making decisions? 0 0   Walking or climbing stairs? 0 0  Dressing or bathing? 0 0  Doing errands, shopping? 0 0  Preparing Food and eating ? N N  Using the Toilet? N N  In the past six months, have you accidently leaked urine? N N  Do you have problems with loss of bowel control? N N  Managing your Medications? N N  Managing your Finances? N N  Housekeeping or managing your Housekeeping? N N    Patient Care Team: Micheal Wolm ORN, MD as PCP - General  I have updated your Care Teams any recent Medical Services you may have received from other providers in the past year.     Assessment:   This  is a routine wellness examination for Paul Burton.  Hearing/Vision screen Hearing Screening - Comments:: Denies hearing difficulties   Vision Screening - Comments:: Wears reading glasses - Not up to date with routine eye exams. Declined referral    Goals Addressed               This Visit's Progress     Increase physical activity (pt-stated)        Remain active.       Depression Screen     12/01/2023    2:48 PM 11/27/2022    2:43 PM 06/26/2022    7:59 AM 11/22/2021    2:41 PM 11/21/2020    3:21 PM 11/21/2020    3:18 PM 07/05/2019   11:38 AM  PHQ 2/9 Scores  PHQ - 2 Score 0 0 0 0 0 0 0    Fall Risk     12/01/2023    2:43 PM 11/27/2023   12:11 PM 11/27/2022    2:44 PM 11/20/2022   11:18 AM 06/26/2022    7:59 AM  Fall Risk   Falls in the past year? 0 0 0 0 0  Number falls in past yr: 0 0 0  0  Injury with Fall? 0 0 0  0  Risk for fall due to : No Fall Risks  No Fall Risks  No Fall Risks  Follow up Falls evaluation completed  Falls prevention discussed  Falls evaluation completed    MEDICARE RISK AT HOME:  Medicare Risk at Home Any stairs in or around the home?: Yes If so, are there any without handrails?: No Home free of loose throw rugs in walkways, pet beds, electrical cords, etc?: Yes Adequate lighting in your home to reduce risk of falls?: Yes Life alert?: No Use of a cane, walker or w/c?:  No Grab bars in the bathroom?: Yes Shower chair or bench in shower?: No Elevated toilet seat or a handicapped toilet?: No  TIMED UP AND GO:  Was the test performed?  No  Cognitive Function: 6CIT completed        12/01/2023    2:46 PM 11/27/2022    2:44 PM 11/22/2021    2:45 PM  6CIT Screen  What Year? 0 points 0 points 0 points  What month? 0 points 0 points 0 points  What time? 0 points 0 points 0 points  Count back from 20 0 points 0 points 0 points  Months in reverse 0 points 0 points 0 points  Repeat phrase 0 points 0 points 0 points  Total Score 0 points 0 points 0 points    Immunizations Immunization History  Administered Date(s) Administered   Fluad Quad(high Dose 65+) 03/13/2020, 03/09/2021   PFIZER(Purple Top)SARS-COV-2 Vaccination 06/26/2019, 07/19/2019, 03/11/2020   Pneumococcal Conjugate-13 07/28/2013   Pneumococcal Polysaccharide-23 03/13/2016   Td 05/13/2006    Screening Tests Health Maintenance  Topic Date Due   Hepatitis C Screening  Never done   Zoster Vaccines- Shingrix (1 of 2) Never done   DTaP/Tdap/Td (2 - Tdap) 05/13/2016   COVID-19 Vaccine (4 - 2024-25 season) 01/12/2023   Medicare Annual Wellness (AWV)  11/30/2024   Pneumococcal Vaccine: 50+ Years  Completed   Hepatitis B Vaccines  Aged Out   HPV VACCINES  Aged Out   Meningococcal B Vaccine  Aged Out   INFLUENZA VACCINE  Discontinued   Colonoscopy  Discontinued    Health Maintenance  Health Maintenance Due  Topic Date Due   Hepatitis C Screening  Never done   Zoster Vaccines- Shingrix (1 of 2) Never done   DTaP/Tdap/Td (2 - Tdap) 05/13/2016   COVID-19 Vaccine (4 - 2024-25 season) 01/12/2023   Health Maintenance Items Addressed:   Additional Screening:  Vision Screening: Recommended annual ophthalmology exams for early detection of glaucoma and other disorders of the eye. Would you like a referral to an eye doctor? No    Dental Screening: Recommended annual dental exams for  proper oral hygiene  Community Resource Referral / Chronic Care Management: CRR required this visit?  No   CCM required this visit?  No   Plan:    I have personally reviewed and noted the following in the patient's chart:   Medical and social history Use of alcohol, tobacco or illicit drugs  Current medications and supplements including opioid prescriptions. Patient is not currently taking opioid prescriptions. Functional ability and status Nutritional status Physical activity Advanced directives List of other physicians Hospitalizations, surgeries, and ER visits in previous 12 months Vitals Screenings to include cognitive, depression, and falls Referrals and appointments  In addition, I have reviewed and discussed with patient certain preventive protocols, quality metrics, and best practice recommendations. A written personalized care plan for preventive services as well as general preventive health recommendations were provided to patient.   Rojelio LELON Blush, LPN   [id number]   After Visit Summary: (MyChart) Due to this being a telephonic visit, the after visit summary with patients personalized plan was offered to patient via MyChart   Notes: Nothing significant to report at this time.

## 2023-12-01 NOTE — Patient Instructions (Addendum)
 Paul Burton , Thank you for taking time out of your busy schedule to complete your Annual Wellness Visit with me. I enjoyed our conversation and look forward to speaking with you again next year. I, as well as your care team,  appreciate your ongoing commitment to your health goals. Please review the following plan we discussed and let me know if I can assist you in the future. Your Game plan/ To Do List    Referrals: If you haven't heard from the office you've been referred to, please reach out to them at the phone provided.   Follow up Visits: Next Medicare AWV with our clinical staff: 12/06/24 @ 2:20p   Have you seen your provider in the last 6 months (3 months if uncontrolled diabetes)?  Next Office Visit with your provider: 06/26/22 @ 8a  Clinician Recommendations:  Aim for 30 minutes of exercise or brisk walking, 6-8 glasses of water, and 5 servings of fruits and vegetables each day.       This is a list of the screening recommended for you and due dates:  Health Maintenance  Topic Date Due   Hepatitis C Screening  Never done   Zoster (Shingles) Vaccine (1 of 2) Never done   DTaP/Tdap/Td vaccine (2 - Tdap) 05/13/2016   COVID-19 Vaccine (4 - 2024-25 season) 01/12/2023   Medicare Annual Wellness Visit  11/30/2024   Pneumococcal Vaccine for age over 38  Completed   Hepatitis B Vaccine  Aged Out   HPV Vaccine  Aged Out   Meningitis B Vaccine  Aged Out   Flu Shot  Discontinued   Colon Cancer Screening  Discontinued    Advanced directives: (In Chart) A copy of your advanced directives are scanned into your chart should your provider ever need it. Advance Care Planning is important because it:  [x]  Makes sure you receive the medical care that is consistent with your values, goals, and preferences  [x]  It provides guidance to your family and loved ones and reduces their decisional burden about whether or not they are making the right decisions based on your wishes.  Follow the  link provided in your after visit summary or read over the paperwork we have mailed to you to help you started getting your Advance Directives in place. If you need assistance in completing these, please reach out to us  so that we can help you!  See attachments for Preventive Care and Fall Prevention Tips.

## 2023-12-02 ENCOUNTER — Encounter: Payer: Self-pay | Admitting: Family Medicine

## 2023-12-02 NOTE — Telephone Encounter (Signed)
 Noted

## 2023-12-08 ENCOUNTER — Ambulatory Visit: Payer: Self-pay | Admitting: Family Medicine

## 2023-12-08 ENCOUNTER — Ambulatory Visit (INDEPENDENT_AMBULATORY_CARE_PROVIDER_SITE_OTHER): Admitting: Family Medicine

## 2023-12-08 VITALS — BP 138/60 | HR 76 | Ht 67.32 in | Wt 146.8 lb

## 2023-12-08 DIAGNOSIS — E785 Hyperlipidemia, unspecified: Secondary | ICD-10-CM | POA: Diagnosis not present

## 2023-12-08 DIAGNOSIS — I10 Essential (primary) hypertension: Secondary | ICD-10-CM | POA: Diagnosis not present

## 2023-12-08 LAB — COMPREHENSIVE METABOLIC PANEL WITH GFR
ALT: 13 U/L (ref 0–53)
AST: 18 U/L (ref 0–37)
Albumin: 4.4 g/dL (ref 3.5–5.2)
Alkaline Phosphatase: 34 U/L — ABNORMAL LOW (ref 39–117)
BUN: 21 mg/dL (ref 6–23)
CO2: 29 meq/L (ref 19–32)
Calcium: 9.3 mg/dL (ref 8.4–10.5)
Chloride: 100 meq/L (ref 96–112)
Creatinine, Ser: 0.92 mg/dL (ref 0.40–1.50)
GFR: 79.85 mL/min (ref >60.00)
Glucose, Bld: 92 mg/dL (ref 70–99)
Potassium: 4.1 meq/L (ref 3.5–5.1)
Sodium: 137 meq/L (ref 135–145)
Total Bilirubin: 0.8 mg/dL (ref 0.2–1.2)
Total Protein: 6.4 g/dL (ref 6.0–8.3)

## 2023-12-08 LAB — LIPID PANEL
Cholesterol: 176 mg/dL (ref 0–200)
HDL: 74.6 mg/dL (ref >39.00)
LDL Cholesterol: 87 mg/dL (ref 0–99)
NonHDL: 101.04
Total CHOL/HDL Ratio: 2
Triglycerides: 71 mg/dL (ref 0.0–149.0)
VLDL: 14.2 mg/dL (ref 0.0–40.0)

## 2023-12-08 MED ORDER — ROSUVASTATIN CALCIUM 5 MG PO TABS: 5.0000 mg | ORAL_TABLET | Freq: Every day | ORAL | 3 refills | Status: AC

## 2023-12-08 MED ORDER — RAMIPRIL 5 MG PO CAPS: 5.0000 mg | ORAL_CAPSULE | Freq: Every day | ORAL | 3 refills | Status: AC

## 2023-12-08 NOTE — Progress Notes (Signed)
 Established Patient Office Visit  Subjective   Patient ID: Paul Burton, male    DOB: 02-26-1946  Age: 78 y.o. MRN: [id number]  Chief Complaint  Patient presents with   Annual Exam    HPI   Paul Burton is here for annual medical follow-up visit.  He has hypertension hyperlipidemia.  He takes ramipril  5 mg daily and rosuvastatin  5 mg daily.  Compliant with medications.  Weight is down about 6 pounds from last visit but he states he has good appetite.  No major dietary changes.  No recent chest pains.  Stays fairly active.  No recent falls.  He notices balance is not what it used to be but no major problems recently.  He and his wife are getting ready to travel to [place] to visit his son who lives there now.  He had recent Medicare annual wellness visit.  No history of RSV vaccine.  He does get annual flu vaccine and plans to get COVID booster this fall.  Aged out of further colonoscopy screening.  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 He has never used smokeless tobacco. He reports that he does not currently use alcohol. He reports that he does not use drugs. family history includes Cancer (age of onset: 62) in his father; Stroke in his mother. No Known Allergies    Review of Systems  Constitutional:  Negative for chills, fever and malaise/fatigue.  Eyes:  Negative for blurred vision.  Respiratory:  Negative for shortness of breath.   Cardiovascular:  Negative for chest pain.  Neurological:  Negative for dizziness, weakness and headaches.      Objective:     BP 138/60 (BP Location: Left Arm, Patient Position: Sitting, Cuff Size: Normal)   Pulse 76   Ht 5' 7.32 (1.71 m)   Wt 146 lb 12.8 oz (66.6 kg)   SpO2 99%   BMI  22.77 kg/m  BP Readings from Last 3 Encounters:  12/08/23 138/60  06/26/22 (!) 142/60  03/27/21 140/60   Wt Readings from Last 3 Encounters:  12/08/23 146 lb 12.8 oz (66.6 kg)  12/01/23 145 lb (65.8 kg)  11/27/22 150 lb (68 kg)      Physical Exam Vitals reviewed.  Constitutional:      General: He is not in acute distress.    Appearance: He is well-developed. He is not ill-appearing.  HENT:     Right Ear: External ear normal.     Left Ear: External ear normal.  Eyes:     Pupils: Pupils are equal, round, and reactive to light.  Neck:     Thyroid : No thyromegaly.  Cardiovascular:     Rate and Rhythm: Normal rate and regular rhythm.  Pulmonary:     Effort: Pulmonary effort is normal. No respiratory distress.     Breath sounds: Normal breath sounds. No wheezing or rales.  Musculoskeletal:     Cervical back: Neck supple.     Right lower leg: No edema.     Left lower leg: No edema.  Neurological:     Mental Status: He is alert and oriented to person, place, and time.      No results found for any visits on 12/08/23.  Last CBC Lab Results  Component Value Date   WBC 3.5 (L) 03/27/2021   HGB 13.7 03/27/2021   HCT 41.4 03/27/2021   MCV 93.1 03/27/2021   MCH 30.1 02/18/2020   RDW 14.2 03/27/2021   PLT 207.0 03/27/2021   Last metabolic panel Lab Results  Component Value Date   GLUCOSE 88 06/26/2022   NA 135 06/26/2022   K 3.8 06/26/2022   CL 98 06/26/2022   CO2 30 06/26/2022   BUN 15 06/26/2022   CREATININE 0.93 06/26/2022   GFR 79.63 06/26/2022   CALCIUM  9.4 06/26/2022   PROT 7.1 06/26/2022   ALBUMIN 4.2 06/26/2022   BILITOT 0.7 06/26/2022   ALKPHOS 34 (L) 06/26/2022   AST 19 06/26/2022   ALT 13 06/26/2022   Last lipids Lab Results  Component Value Date   CHOL 168 06/26/2022   HDL 66.40 06/26/2022   LDLCALC 83 06/26/2022   TRIG 90.0 06/26/2022   CHOLHDL 3 06/26/2022      The 89-bzjm ASCVD risk score (Arnett DK, et al., 2019) is: 33.9%     Assessment & Plan:   Problem List Items Addressed This Visit       Unprioritized   Hyperlipidemia - Primary   Relevant Medications   ramipril  (ALTACE ) 5 MG capsule   rosuvastatin  (CRESTOR ) 5 MG tablet   Other Relevant Orders   Lipid panel   CMP   Essential hypertension   Relevant Medications   ramipril  (ALTACE ) 5 MG capsule   rosuvastatin  (CRESTOR ) 5 MG tablet   Other Relevant Orders   CMP  78 year old male with controlled hypertension.  He brings in a log today of several home readings which are consistently well-controlled.  Hyperlipidemia treated with rosuvastatin .  Refilled both medications for 1 year.  Check lipid and CMP.  Recommend he consider RSV vaccine when he gets flu vaccine this fall.  No follow-ups on file.    Wolm Scarlet, MD

## 2023-12-08 NOTE — Patient Instructions (Signed)
 Consider RSV (respiratory syncytial virus) vaccine this Fall when you get Flu vaccine.

## 2024-02-16 DIAGNOSIS — Z23 Encounter for immunization: Secondary | ICD-10-CM | POA: Diagnosis not present

## 2024-12-06 ENCOUNTER — Ambulatory Visit
